# Patient Record
Sex: Female | Born: 1970
Health system: Southern US, Community
[De-identification: ages and names within clinical notes are randomized; demographics above are authoritative.]

## PROBLEM LIST (undated history)

## (undated) DIAGNOSIS — R519 Headache, unspecified: Secondary | ICD-10-CM

## (undated) DIAGNOSIS — H539 Unspecified visual disturbance: Secondary | ICD-10-CM

## (undated) DIAGNOSIS — G35 Multiple sclerosis: Secondary | ICD-10-CM

## (undated) DIAGNOSIS — R51 Headache: Secondary | ICD-10-CM

## (undated) HISTORY — DX: Headache, unspecified: R51.9

## (undated) HISTORY — PX: KNEE ARTHROSCOPY: SHX127

## (undated) HISTORY — DX: Unspecified visual disturbance: H53.9

## (undated) HISTORY — DX: Headache: R51

## (undated) HISTORY — DX: Multiple sclerosis: G35

---

## 1997-08-26 ENCOUNTER — Other Ambulatory Visit: Admission: RE | Admit: 1997-08-26 | Discharge: 1997-08-26 | Payer: Self-pay | Admitting: Obstetrics and Gynecology

## 1997-10-10 ENCOUNTER — Other Ambulatory Visit: Admission: RE | Admit: 1997-10-10 | Discharge: 1997-10-10 | Payer: Self-pay | Admitting: Obstetrics and Gynecology

## 1998-03-24 ENCOUNTER — Other Ambulatory Visit: Admission: RE | Admit: 1998-03-24 | Discharge: 1998-03-24 | Payer: Self-pay | Admitting: Obstetrics and Gynecology

## 1999-01-29 ENCOUNTER — Other Ambulatory Visit: Admission: RE | Admit: 1999-01-29 | Discharge: 1999-01-29 | Payer: Self-pay | Admitting: Obstetrics and Gynecology

## 2000-01-26 ENCOUNTER — Other Ambulatory Visit: Admission: RE | Admit: 2000-01-26 | Discharge: 2000-01-26 | Payer: Self-pay | Admitting: Obstetrics and Gynecology

## 2000-08-16 ENCOUNTER — Other Ambulatory Visit: Admission: RE | Admit: 2000-08-16 | Discharge: 2000-08-16 | Payer: Self-pay | Admitting: Obstetrics and Gynecology

## 2001-02-02 ENCOUNTER — Other Ambulatory Visit: Admission: RE | Admit: 2001-02-02 | Discharge: 2001-02-02 | Payer: Self-pay | Admitting: Obstetrics and Gynecology

## 2001-08-16 ENCOUNTER — Other Ambulatory Visit: Admission: RE | Admit: 2001-08-16 | Discharge: 2001-08-16 | Payer: Self-pay | Admitting: Obstetrics and Gynecology

## 2002-02-20 ENCOUNTER — Other Ambulatory Visit: Admission: RE | Admit: 2002-02-20 | Discharge: 2002-02-20 | Payer: Self-pay | Admitting: Obstetrics and Gynecology

## 2003-03-27 ENCOUNTER — Other Ambulatory Visit: Admission: RE | Admit: 2003-03-27 | Discharge: 2003-03-27 | Payer: Self-pay | Admitting: Obstetrics and Gynecology

## 2004-03-16 ENCOUNTER — Other Ambulatory Visit: Admission: RE | Admit: 2004-03-16 | Discharge: 2004-03-16 | Payer: Self-pay | Admitting: Obstetrics and Gynecology

## 2004-05-07 ENCOUNTER — Ambulatory Visit (HOSPITAL_COMMUNITY): Admission: RE | Admit: 2004-05-07 | Discharge: 2004-05-07 | Payer: Self-pay | Admitting: Orthopedic Surgery

## 2004-05-07 ENCOUNTER — Ambulatory Visit (HOSPITAL_BASED_OUTPATIENT_CLINIC_OR_DEPARTMENT_OTHER): Admission: RE | Admit: 2004-05-07 | Discharge: 2004-05-07 | Payer: Self-pay | Admitting: Orthopedic Surgery

## 2005-03-30 ENCOUNTER — Other Ambulatory Visit: Admission: RE | Admit: 2005-03-30 | Discharge: 2005-03-30 | Payer: Self-pay | Admitting: Obstetrics and Gynecology

## 2006-12-15 ENCOUNTER — Emergency Department: Payer: Self-pay | Admitting: Emergency Medicine

## 2007-01-04 ENCOUNTER — Ambulatory Visit (HOSPITAL_COMMUNITY): Admission: RE | Admit: 2007-01-04 | Discharge: 2007-01-04 | Payer: Self-pay | Admitting: Obstetrics and Gynecology

## 2010-07-03 NOTE — Op Note (Signed)
NAME:  Lindsay Carter, Lindsay Carter NO.:  [id number]   MEDICAL RECORD NO.:  [id number]          PATIENT TYPE:  AMB   LOCATION:  [facility]                         FACILITY:  [facility]   PHYSICIAN:  Marlowe Kays, M.D.  DATE OF BIRTH:  [birth year]   DATE OF PROCEDURE:  05/07/2004  DATE OF DISCHARGE:                                 OPERATIVE REPORT   PREOPERATIVE DIAGNOSIS:  Torn discoid lateral meniscus, left knee.   POSTOPERATIVE DIAGNOSES:  1.  Torn discoid lateral meniscus.  2.  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   PROCEDURE:  Left knee arthroscopy with (1) partial lateral meniscectomy, (2)  shaving of patella.   SURGEON:  Marlowe Kays, M.D.   ASSISTANT:  Nurse.   ANESTHESIA:  General.   INDICATIONS FOR PROCEDURE:  Because of a painful left knee, an MRI was  performed on April 03, 2004 which showed a discoid lateral meniscus.  At  surgery, she also had in some areas close to if not full-thickness wear of  her patella centrally.  The remainder of the joint looked normal.   DESCRIPTION OF PROCEDURE:  Under satisfactory general anesthesia with a  pneumatic tourniquet, the leg was Esmarched out nonsterilely.  Thigh  stabilizer, Ace wrap, and knee protector for the right leg.  The left leg  was prepped from stabilizer to the ankle, DuraPrep, draped in sterile field.   Superomedial saline inflow.  First through an anterolateral portal in the  medial portal, the knee joint was evaluated.  It was completely normal, with  normal meniscus on probing.  A representative picture was taken.  I then  lifted up the medial gutter in the suprapatellar area with the patellar  findings as noted above.  With a 3.5 shaver, I shaved this down until  smooth, and a final picture was taken.  I then reversed portals.  ACL looked  normal.  She had a good bit of synovitis laterally which I resected with the  3.5 shaver which allowed good visualization to the lateral  compartment of  the knee joint.  The joint surfaces basically were normal.  She did have  almost a complete discoid lateral meniscus with a substantial tear through  several areas posteriorly, one near the intercondylar area and one  posteriorly.  I trimmed back the meniscus to a stable rim, with a nice  smooth taper using baskets and smoothed it down with the 3.5 shaver.  The  remaining meniscus actually had almost a normal appearance, other than some  thickening anteriorly when I finished.  It was stable on probing.  The knee  joint was then irrigated until clear and all fluid possible removed.  The  two anterior portals were closed with 4-0 nylon, and 20 cc of 0.5% Marcaine  with adrenaline and 4 mg of morphine were then instilled through the inflow  apparatus which was removed and this portal closed with 4-0 nylon as well.  Betadine and Adaptic dressing were applied.  The tourniquet was released.   She tolerated the procedure well and was taken to  the recovery room in  satisfactory condition, with no known complications.      JA/MEDQ  D:  05/07/2004  T:  05/07/2004  Job:  [id number]

## 2011-05-10 ENCOUNTER — Other Ambulatory Visit: Payer: Self-pay | Admitting: Family Medicine

## 2011-05-10 ENCOUNTER — Other Ambulatory Visit (HOSPITAL_COMMUNITY)
Admission: RE | Admit: 2011-05-10 | Discharge: 2011-05-10 | Disposition: A | Discharge status: Home / Self Care | Payer: BC Managed Care – PPO | Source: Ambulatory Visit | Attending: Family Medicine | Admitting: Family Medicine

## 2011-05-10 DIAGNOSIS — Z124 Encounter for screening for malignant neoplasm of cervix: Secondary | ICD-10-CM | POA: Insufficient documentation

## 2011-05-10 DIAGNOSIS — Z1159 Encounter for screening for other viral diseases: Secondary | ICD-10-CM | POA: Insufficient documentation

## 2013-08-06 ENCOUNTER — Other Ambulatory Visit: Payer: Self-pay

## 2013-08-06 DIAGNOSIS — Z1231 Encounter for screening mammogram for malignant neoplasm of breast: Secondary | ICD-10-CM

## 2013-08-14 ENCOUNTER — Ambulatory Visit
Admission: RE | Admit: 2013-08-14 | Discharge: 2013-08-14 | Disposition: A | Discharge status: Home / Self Care | Payer: BC Managed Care – PPO | Source: Ambulatory Visit

## 2013-08-14 ENCOUNTER — Encounter (INDEPENDENT_AMBULATORY_CARE_PROVIDER_SITE_OTHER): Payer: Self-pay

## 2013-08-14 DIAGNOSIS — Z1231 Encounter for screening mammogram for malignant neoplasm of breast: Secondary | ICD-10-CM

## 2014-04-08 ENCOUNTER — Telehealth: Payer: Self-pay | Admitting: Neurology

## 2014-04-08 NOTE — Telephone Encounter (Signed)
Attempted to contact Lindsay Carter.  Phone rang without being answered, no ans. machine.  Need to let her now that mri can't be ordered until she's been seen at [facility].  Even tho she saw RAS at [facility], she must be an established pt. at [facility] before he can order mri/fim

## 2014-04-08 NOTE — Telephone Encounter (Signed)
Patient scheduled for 6 mo MS fu on 3/2 and normally have MRI prior to appointment.  Please call and advise.

## 2014-04-09 NOTE — Telephone Encounter (Signed)
Spoke with Lindsay Carter and advised mri can't be ordered until she has been seen here once.  In the future, we will be happy to order mri's/ov appt's to be done on the same day.  She verbalized understanding of same/fim

## 2014-04-17 ENCOUNTER — Ambulatory Visit: Payer: Self-pay | Admitting: Neurology

## 2014-04-23 ENCOUNTER — Encounter: Payer: Self-pay | Admitting: Neurology

## 2014-04-23 ENCOUNTER — Ambulatory Visit (INDEPENDENT_AMBULATORY_CARE_PROVIDER_SITE_OTHER): Payer: BLUE CROSS/BLUE SHIELD | Admitting: Neurology

## 2014-04-23 VITALS — BP 142/80 | HR 98 | Resp 14 | Ht 69.0 in | Wt 208.2 lb

## 2014-04-23 DIAGNOSIS — M545 Low back pain, unspecified: Secondary | ICD-10-CM

## 2014-04-23 DIAGNOSIS — G4700 Insomnia, unspecified: Secondary | ICD-10-CM | POA: Diagnosis not present

## 2014-04-23 DIAGNOSIS — R5383 Other fatigue: Secondary | ICD-10-CM | POA: Diagnosis not present

## 2014-04-23 DIAGNOSIS — G35 Multiple sclerosis: Secondary | ICD-10-CM | POA: Diagnosis not present

## 2014-04-23 DIAGNOSIS — M21371 Foot drop, right foot: Secondary | ICD-10-CM | POA: Insufficient documentation

## 2014-04-23 MED ORDER — ETODOLAC 400 MG PO TABS
400.0000 mg | ORAL_TABLET | Freq: Two times a day (BID) | ORAL | Status: DC
Start: 2014-04-23 — End: 2014-10-25

## 2014-04-23 NOTE — Progress Notes (Signed)
[facility]  PATIENT: Lindsay Carter DOB: 09/03/1970  REFERRING DOCTOR OR PCP:  Jordan Hawks Rankins   SOURCE: patient and [facility] records                    _________________________________   HISTORICAL  CHIEF COMPLAINT:  Chief Complaint  Patient presents with  . Multiple Sclerosis    Sts. she tolerates Copaxone well.  Thinks fatigue is some worse but thinks this may be due to allergies.  She takes Etodolac for right sided ankle, knee and hip pain, and needs a r/f of this/fim    HISTORY OF PRESENT ILLNESS:  Lindsay Carter is a 44 year old with relapsing remitting multiple sclerosis. Prior to 2010, she presented with several episodes of numbness and MRI of the spine was normal.   In 2010, she had an episode of limping and was referred to me.   MRI of the spine showed one focus and MRI of the brain showed multiple foci consistent withShconsistent with the clinical diagnosis of multiple sclerosis.  There was one enhancing plaque in the left posterior frontal lobe.   She was started on Copaxone and noted mild lipoatrophy. Of note, she is HLA DR 1501 positive. She was clinically stable, she initially went to Copaxone 20 mg every other day in 2012 and then switched to 40 mg 3 times a week in 2014. In 2015 she switch to 40 mg twice a week. She tolerates Copaxone fairly well but does have some skin reaction.  She has several symptoms related to her multiple sclerosis.  Gait/strength/sensation: Gait is impaired due to right foot drop that has been present since her initial transverse myelitis. She stumbles some but does not fall. She notes weakness only in the right leg. She denies any significant numbness in either leg. She notes no difficulty with her arms.   She stumbles more when on a carpet.     She does worse later in day when more fatigued.    Bladder:  She has mild urinary frequency and urgency that she notes is more for problem if she drinks a lot of  water. She has had some stress incontinence with coughing, but just drops, did not empty her bladder.  Vision: She denies any difficulty with vision. There is no diplopia or eye pain.  Fatigue/sleep: She has had fatigue since her diagnosis. Most days this is mild. She does worse when she has allergies.   Modafinil helped the fatigue some but caused a headache. She had some insomnia but that improved after she started taking melatonin.  Mood/cognition. She denies any significant depression or anxiety. She has not had any major cognitive dysfunction but feels processing speed has slowed some.  She uses lists as reminders.  She has some low back pain that is helped by etodolac.   She feels it is better than last year.    I reviewed MRI reports from 02/22/2008, 03/14/2009, 02/24/2010, 03/31/2011, to 01/04/2013, 04/25/2013.      The initial MRI showed one enhancing plaque. The 2011 MRI showed no changes in the one-year interim. However, in 2012, showed 1 new enhancing lesion in the left posterior frontal lobe. MRI in 2013, 2014 and 2015 were stable.   REVIEW OF SYSTEMS: Constitutional: No fevers, chills, sweats, or change in appetite Eyes: No visual changes, double vision, eye pain Ear, nose and throat: No hearing loss, ear pain, nasal congestion, sore throat Cardiovascular: No chest pain, palpitations Respiratory: No shortness of breath  at rest or with exertion.   No wheezes GastrointestinaI: No nausea, vomiting, diarrhea, abdominal pain, fecal incontinence Genitourinary: No dysuria, urinary retention.  Minimal frequency and minimal stress incontinence.  No nocturia. Musculoskeletal: No neck pain.   Improved Lower back pain Integumentary: No rash, pruritus, skin lesions Neurological: as above Psychiatric: No depression at this time.  No anxiety Endocrine: No palpitations, diaphoresis, change in appetite, change in weigh or increased thirst Hematologic/Lymphatic: No anemia, purpura,  petechiae. Allergic/Immunologic: No itchy/runny eyes, nasal congestion, recent allergic reactions, rashes  ALLERGIES: Allergies  Allergen Reactions  . Erythromycin Nausea And Vomiting    HOME MEDICATIONS:  Current outpatient prescriptions:  .  cetirizine (ZYRTEC) 10 MG tablet, Take 10 mg by mouth daily., Disp: , Rfl: 4 .  cholecalciferol (VITAMIN D) 1000 UNITS tablet, Take 2,000 Units by mouth daily., Disp: , Rfl:  .  COPAXONE 40 MG/ML SOSY, Inject 40 mg into the skin 2 (two) times a week., Disp: , Rfl: 1 .  Cranberry 125 MG TABS, Take 125 mg by mouth daily., Disp: , Rfl:  .  etodolac (LODINE) 400 MG tablet, Take 400 mg by mouth 2 (two) times daily., Disp: , Rfl: 11 .  Flaxseed, Linseed, (FLAXSEED OIL) 1200 MG CAPS, Take 1,200 mg by mouth daily., Disp: , Rfl:  .  fluticasone (FLONASE) 50 MCG/ACT nasal spray, , Disp: , Rfl: 2 .  JUNEL 1.5/30 1.5-30 MG-MCG tablet, Take 1 tablet by mouth daily., Disp: , Rfl: 3 .  Melatonin 3 MG CAPS, Take 3 mg by mouth at bedtime., Disp: , Rfl:  .  simvastatin (ZOCOR) 10 MG tablet, Take 10 mg by mouth every evening., Disp: , Rfl: 1 .  vitamin B-12 (CYANOCOBALAMIN) 500 MCG tablet, Take 500 mcg by mouth 2 (two) times daily., Disp: , Rfl:  .  vitamin C (ASCORBIC ACID) 500 MG tablet, Take 1,000 mg by mouth daily., Disp: , Rfl:   PAST MEDICAL HISTORY: Past Medical History  Diagnosis Date  . Headache   . Multiple sclerosis   . Vision abnormalities     PAST SURGICAL HISTORY: Past Surgical History  Procedure Laterality Date  . Knee arthroscopy Left     2006    FAMILY HISTORY: Family History  Problem Relation Age of Onset  . Hypertension Mother   . Ataxia Father   . CAD Father   . Parkinson's disease Father   . Bladder Cancer Father     SOCIAL HISTORY:  History   Social History  . Marital Status: Married    Spouse Name: N/A  . Number of Children: N/A  . Years of Education: N/A   Occupational History  . Glass blower/designer    Social  History Main Topics  . Smoking status: Never Smoker   . Smokeless tobacco: Not on file  . Alcohol Use: 0.0 oz/week    0 Standard drinks or equivalent per week     Comment: 2-3 beers per week/fim  . Drug Use: No  . Sexual Activity: Not on file   Other Topics Concern  . Not on file   Social History Narrative  . No narrative on file     PHYSICAL EXAM  Filed Vitals:   04/23/14 1046  BP: 142/80  Pulse: 98  Resp: 14  Height: '5\' 9"'  (1.753 m)  Weight: 208 lb 3.2 oz (94.439 kg)    Body mass index is 30.73 kg/(m^2).   General: The patient is well-developed and well-nourished and in no acute distress  Eyes:  Funduscopic  exam shows normal optic discs and retinal vessels.  Neck: The neck is supple, no carotid bruits are noted.  The neck is nontender.  Cardiovascular: The heart has a regular rate and rhythm with a normal S1 and S2. There were no murmurs, gallops or rubs. Lungs are clear to auscultation.  Skin: Extremities are without significant edema.  Musculoskeletal:  Back is nontender  Neurologic Exam  Mental status: The patient is alert and oriented x 3 at the time of the examination. The patient has apparent normal recent and remote memory, with an apparently normal attention span and concentration ability.   Speech is normal.  Cranial nerves: Extraocular movements are full. Pupils are equal, round, and reactive to light and accomodation.  Visual fields are full.  Facial symmetry is present. There is good facial sensation to soft touch bilaterally.Facial strength is normal.  Trapezius and sternocleidomastoid strength is normal. No dysarthria is noted.  The tongue is midline, and the patient has symmetric elevation of the soft palate. No obvious hearing deficits are noted.  Motor:  Muscle bulk is normal.   Tone is increased in R>L legs. Strength is  5 / 5 in all 4 extremities with 4++/5 at right ankle/EHL.   Sensory: Sensory testing is intact to pinprick, soft touch and  vibration sensation in arms but she has decreased vibration and touch in right foot.  Coordination: Cerebellar testing reveals good finger-nose-finger and heel-to-shin bilaterally.  Gait and station: Station is normal.   Gait shows right foot drop and is mildly wide. Tandem gait is wide. Romberg is negative.   Reflexes: Deep tendon reflexes are increased on both legs with spread at the knees and non-sustained clonus at right ankle.   Plantar responses are flexor.    DIAGNOSTIC DATA (LABS, IMAGING, TESTING) - I reviewed patient records, labs, notes, testing and imaging myself where available.     ASSESSMENT AND PLAN  Multiple sclerosis - Plan: MR Brain W Wo Contrast  Right foot drop - Plan: MR Brain W Wo Contrast  Other fatigue  Insomnia  Midline low back pain without sciatica   In summary, Tayja Manzer is a 44 year old woman with relapsing remitting multiple sclerosis who has been fairly stable on Copaxone therapy. Her main problem is a right due to mild right leg weakness, spasticity and numbness. This has worsened a little bit over the past few years I am going to send her to an orthotist to make an AFO brace or similar device for the right foot/ankle.  We had also discussed an electrostimulation foot drop that she was less interested. I will check an MRI of the brain with and without contrast to rule out subclinical progression. If this is occurring we will need to discuss more efficacious medicines. If the MRI is stable or there are no more than one new lesions, then she will likely stay on Copaxone.  She asked where there she can be vaccinated for shingles. She has had the pneumonia and flu shots in the past. Because Copaxone is a pure immunomodulator and does not have any immunosuppressive activity, it is safe for her to have a live vaccine such as the shingles.  She will return to see me in 6 months or sooner if she has new or worsening neurologic symptoms.  Minute  face-to-face evaluation with greater than 50% of the time counseling and coordinating care about her multiple sclerosis and related symptoms.   Falyn Rubel A. Felecia Shelling, MD, PhD 8/0/9983, 38:25 AM Certified in Neurology,  Clinical Neurophysiology, Sleep Medicine, Pain Medicine and Neuroimaging  [facility] [address], [place], [place] [address] [phone]

## 2014-05-08 ENCOUNTER — Ambulatory Visit (INDEPENDENT_AMBULATORY_CARE_PROVIDER_SITE_OTHER): Payer: BLUE CROSS/BLUE SHIELD

## 2014-05-15 ENCOUNTER — Other Ambulatory Visit: Payer: BLUE CROSS/BLUE SHIELD

## 2014-06-26 ENCOUNTER — Other Ambulatory Visit: Payer: BLUE CROSS/BLUE SHIELD

## 2014-07-08 ENCOUNTER — Telehealth: Payer: Self-pay | Admitting: Neurology

## 2014-07-08 MED ORDER — COPAXONE 40 MG/ML ~~LOC~~ SOSY: 40.0000 mg | PREFILLED_SYRINGE | SUBCUTANEOUS | Status: DC

## 2014-07-08 NOTE — Telephone Encounter (Signed)
We unfortunately have not gotten any faxes from [facility].  I called back.  Patient says she needs to get a refill on Coapxone 40mg .  Indicates she uses two injections weekly.  Thinks they may have faxed [facility] in error.  Thank you.

## 2014-07-08 NOTE — Telephone Encounter (Signed)
Patient is calling to see if [facility] has faxed over a request for Rx copaxone 40 mg. Please call patient.

## 2014-07-08 NOTE — Telephone Encounter (Signed)
I have spoken with Lindsay Carter this afternoon and advised that I have not received rx. request from [facility], but I have escribed Copaxone to them today/fim

## 2014-07-09 NOTE — Telephone Encounter (Signed)
I have spoken with [facility] and clarified rx./fim

## 2014-07-09 NOTE — Telephone Encounter (Signed)
Lindsay Carter with [facility] called requesting clarification on directions for  COPAXONE 40 MG/ML SOSY. Please call and advise. He can be reached at [phone].

## 2014-07-09 NOTE — Addendum Note (Signed)
Addended by: Candis Schatz I on: 07/09/2014 09:32 AM   Modules accepted: Orders

## 2014-07-10 NOTE — Telephone Encounter (Addendum)
Patient called regarding  COPAXONE 40 MG/ML SOSY and MRI. Please call and advise. Pt can be reached at [phone].

## 2014-07-11 NOTE — Telephone Encounter (Signed)
I have spoken with Lindsay Carter who sts. she just wanted to let me know that she received her Copaxone from the pharmacy, and has spoken with [name] about r/s mri.  I appreciate the feedback so that I know she has been taken care of.  She will call me if she needs anything else/fim

## 2014-07-18 ENCOUNTER — Other Ambulatory Visit: Payer: BLUE CROSS/BLUE SHIELD

## 2014-09-03 ENCOUNTER — Other Ambulatory Visit: Payer: Self-pay

## 2014-09-03 ENCOUNTER — Ambulatory Visit
Admission: RE | Admit: 2014-09-03 | Discharge: 2014-09-03 | Disposition: A | Discharge status: Home / Self Care | Payer: BLUE CROSS/BLUE SHIELD | Source: Ambulatory Visit | Attending: Neurology | Admitting: Neurology

## 2014-09-03 DIAGNOSIS — G35 Multiple sclerosis: Secondary | ICD-10-CM | POA: Diagnosis not present

## 2014-09-03 MED ORDER — GADOBENATE DIMEGLUMINE 529 MG/ML IV SOLN
19.0000 mL | Freq: Once | INTRAVENOUS | Status: AC | PRN
  Administered 2014-09-03: 19 mL via INTRAVENOUS

## 2014-09-06 ENCOUNTER — Telehealth: Payer: Self-pay | Admitting: *Deleted

## 2014-09-06 NOTE — Telephone Encounter (Signed)
LMOM (identified vm) that mri showed no brand new lesions--that I have requested old mri brain from [facility]--RAS will compare them and if there are changes on comparison, I will call her back.  She does not need to return this call unless she has questions/fim

## 2014-09-06 NOTE — Telephone Encounter (Signed)
-----   Message from Asa Lente, MD sent at 09/06/2014 10:48 AM EDT ----- MRI showed no brand new lesions    We will need to get last MRI's from [facility] for comparison

## 2014-10-03 ENCOUNTER — Other Ambulatory Visit: Payer: Self-pay | Admitting: Physician Assistant

## 2014-10-03 ENCOUNTER — Other Ambulatory Visit (HOSPITAL_COMMUNITY)
Admission: RE | Admit: 2014-10-03 | Discharge: 2014-10-03 | Disposition: A | Discharge status: Home / Self Care | Payer: BLUE CROSS/BLUE SHIELD | Source: Ambulatory Visit | Attending: Physician Assistant | Admitting: Physician Assistant

## 2014-10-03 DIAGNOSIS — Z1151 Encounter for screening for human papillomavirus (HPV): Secondary | ICD-10-CM | POA: Insufficient documentation

## 2014-10-03 DIAGNOSIS — Z124 Encounter for screening for malignant neoplasm of cervix: Secondary | ICD-10-CM | POA: Diagnosis present

## 2014-10-04 LAB — CYTOLOGY - PAP

## 2014-10-25 ENCOUNTER — Encounter: Payer: Self-pay | Admitting: Neurology

## 2014-10-25 ENCOUNTER — Ambulatory Visit (INDEPENDENT_AMBULATORY_CARE_PROVIDER_SITE_OTHER): Payer: BLUE CROSS/BLUE SHIELD | Admitting: Neurology

## 2014-10-25 ENCOUNTER — Telehealth: Payer: Self-pay | Admitting: *Deleted

## 2014-10-25 VITALS — BP 128/76 | HR 88 | Resp 16 | Ht 69.0 in | Wt 197.0 lb

## 2014-10-25 DIAGNOSIS — G4700 Insomnia, unspecified: Secondary | ICD-10-CM | POA: Diagnosis not present

## 2014-10-25 DIAGNOSIS — R5383 Other fatigue: Secondary | ICD-10-CM | POA: Diagnosis not present

## 2014-10-25 DIAGNOSIS — M21371 Foot drop, right foot: Secondary | ICD-10-CM | POA: Diagnosis not present

## 2014-10-25 DIAGNOSIS — G35 Multiple sclerosis: Secondary | ICD-10-CM | POA: Diagnosis not present

## 2014-10-25 MED ORDER — ETODOLAC 400 MG PO TABS: 400.0000 mg | ORAL_TABLET | Freq: Two times a day (BID) | ORAL | Status: DC

## 2014-10-25 NOTE — Telephone Encounter (Signed)
-----   Message from Asa Lente, MD sent at 10/25/2014  9:12 AM EDT ----- Please get her last brain (and spine if available) MRI's from [facility]

## 2014-10-25 NOTE — Progress Notes (Signed)
[facility]  PATIENT: Lindsay Carter DOB: 03-07-1970  REFERRING DOCTOR OR PCP:  Jordan Hawks Rankins   SOURCE: patient and [facility] records                    _________________________________   HISTORICAL  CHIEF COMPLAINT:  Chief Complaint  Patient presents with  . Multiple Sclerosis    Sts. she continues to tolerate Copaxone well.  She is ambulatory with slight limp, weaker right leg with some foot drop.  She has not had afo made yet.  Today she has a question regarding mmr shot and if she can have it./fim    HISTORY OF PRESENT ILLNESS:  Lindsay Carter is a 44 year old with relapsing remitting multiple sclerosis. She is on Copaxone and tolerates it well.   No definite exacerbations.  Her last MRI earlier this year showed no enhancing foci.   Older films were not available for comparison.   Gait/strength/sensation: She has a right foot drop that has been present since her initial transverse myelitis. She stumbles some but does not fall. She feels her foot drop has mildly worsened over the past few years but not since her last visit.   She never got the AFO.   She notes weakness only in the right leg. She denies any significant numbness in either leg. She notes no difficulty with her arms.     She does worse later in day when more fatigued.    Bladder:  She usually does well. She has mild urinary frequency and urgency some days, especially if she is drinking a lot of water.  She has had some stress incontinence with coughing, but just drops, did not empty her bladder.  Vision: She denies any difficulty with vision. There is no diplopia or eye pain.  Fatigue/sleep: She has had fatigue since her diagnosis. Most days this is mild. She does worse when she has allergies.   Modafinil helped the fatigue some but caused a headache and she stopped. She had some insomnia with early awakening some days but she falls asleep easily.     Mood/cognition. She  denies any significant depression or anxiety. She has not had any major cognitive dysfunction but feels processing speed has slowed some.  She uses lists as reminders.  She has mild non radiating axial low back pain that is helped by etodolac.   MS History:   Prior to 2010, she presented with several episodes of numbness and MRI of the spine was normal.   In 2010, she had an episode of limping and was referred to me.   MRI of the spine showed one focus and MRI of the brain showed multiple foci consistent withShconsistent with the clinical diagnosis of multiple sclerosis.  There was one enhancing plaque in the left posterior frontal lobe.   She was started on Copaxone and noted mild lipoatrophy. Of note, she is HLA DR 1501 positive. She was clinically stable, she initially went to Copaxone 20 mg every other day in 2012 and then switched to 40 mg 3 times a week in 2014. In 2015 she switch to 40 mg twice a week.     REVIEW OF SYSTEMS: Constitutional: No fevers, chills, sweats, or change in appetite Eyes: No visual changes, double vision, eye pain Ear, nose and throat: No hearing loss, ear pain, nasal congestion, sore throat Cardiovascular: No chest pain, palpitations Respiratory: No shortness of breath at rest or with exertion.   No wheezes GastrointestinaI: No  nausea, vomiting, diarrhea, abdominal pain, fecal incontinence Genitourinary: No dysuria, urinary retention.  Minimal frequency and minimal stress incontinence.  No nocturia. Musculoskeletal: No neck pain.   Improved Lower back pain Integumentary: No rash, pruritus, skin lesions Neurological: as above Psychiatric: No depression at this time.  No anxiety Endocrine: No palpitations, diaphoresis, change in appetite, change in weigh or increased thirst Hematologic/Lymphatic: No anemia, purpura, petechiae. Allergic/Immunologic: No itchy/runny eyes, nasal congestion, recent allergic reactions, rashes  ALLERGIES: Allergies  Allergen  Reactions  . Erythromycin Nausea And Vomiting    HOME MEDICATIONS:  Current outpatient prescriptions:  .  cetirizine (ZYRTEC) 10 MG tablet, Take 10 mg by mouth daily., Disp: , Rfl: 4 .  cholecalciferol (VITAMIN D) 1000 UNITS tablet, Take 2,000 Units by mouth daily., Disp: , Rfl:  .  COPAXONE 40 MG/ML SOSY, Inject 40 mg into the skin 2 (two) times a week., Disp: 24 Syringe, Rfl: 3 .  Cranberry 125 MG TABS, Take 125 mg by mouth daily., Disp: , Rfl:  .  etodolac (LODINE) 400 MG tablet, Take 1 tablet (400 mg total) by mouth 2 (two) times daily., Disp: 60 tablet, Rfl: 11 .  fluticasone (FLONASE) 50 MCG/ACT nasal spray, , Disp: , Rfl: 2 .  JUNEL 1.5/30 1.5-30 MG-MCG tablet, Take 1 tablet by mouth daily., Disp: , Rfl: 3 .  Melatonin 3 MG CAPS, Take 3 mg by mouth at bedtime., Disp: , Rfl:  .  simvastatin (ZOCOR) 10 MG tablet, Take 10 mg by mouth every evening., Disp: , Rfl: 1 .  Flaxseed, Linseed, (FLAXSEED OIL) 1200 MG CAPS, Take 1,200 mg by mouth daily., Disp: , Rfl:  .  vitamin B-12 (CYANOCOBALAMIN) 500 MCG tablet, Take 500 mcg by mouth 2 (two) times daily., Disp: , Rfl:  .  vitamin C (ASCORBIC ACID) 500 MG tablet, Take 1,000 mg by mouth daily., Disp: , Rfl:   PAST MEDICAL HISTORY: Past Medical History  Diagnosis Date  . Headache   . Multiple sclerosis   . Vision abnormalities     PAST SURGICAL HISTORY: Past Surgical History  Procedure Laterality Date  . Knee arthroscopy Left     2006    FAMILY HISTORY: Family History  Problem Relation Age of Onset  . Hypertension Mother   . Ataxia Father   . CAD Father   . Parkinson's disease Father   . Bladder Cancer Father     SOCIAL HISTORY:  Social History   Social History  . Marital Status: Married    Spouse Name: N/A  . Number of Children: N/A  . Years of Education: N/A   Occupational History  . Glass blower/designer    Social History Main Topics  . Smoking status: Never Smoker   . Smokeless tobacco: Not on file  . Alcohol Use:  0.0 oz/week    0 Standard drinks or equivalent per week     Comment: 2-3 beers per week/fim  . Drug Use: No  . Sexual Activity: Not on file   Other Topics Concern  . Not on file   Social History Narrative     PHYSICAL EXAM  Filed Vitals:   10/25/14 0854  BP: 128/76  Pulse: 88  Resp: 16  Height: _0  (1.753 m)  Weight: 197 lb (89.359 kg)    Body mass index is 29.08 kg/(m^2).   General: The patient is well-developed and well-nourished and in no acute distress   Neurologic Exam  Mental status: The patient is alert and oriented x 3 at the  time of the examination. The patient has apparent normal recent and remote memory, with an apparently normal attention span and concentration ability.   Speech is normal.  Cranial nerves: Extraocular movements are full.    There is good facial sensation to soft touch bilaterally.Facial strength is normal.  Trapezius and sternocleidomastoid strength is normal. No dysarthria is noted.  No obvious hearing deficits are noted.  Motor:  Muscle bulk is normal.   Tone is increased in R>L legs. Strength is  5 / 5 in all 4 extremities with 4++/5 at right ankle/EHL.   Sensory: Sensory testing is intact to pinprick, soft touch and vibration sensation in arms but she has decreased vibration and touch in right foot.  Coordination: Cerebellar testing reveals good finger-nose-finger and heel-to-shin bilaterally.  Gait and station: Station is normal.   Gait shows right foot drop and is mildly wide. Tandem gait is wide. Romberg is negative.   Reflexes: Deep tendon reflexes are increased on both legs with spread at the knees and non-sustained clonus at right ankle.        DIAGNOSTIC DATA (LABS, IMAGING, TESTING) - I reviewed patient records, labs, notes, testing and imaging myself where available.     ASSESSMENT AND PLAN  Multiple sclerosis  Right foot drop  Other fatigue  Insomnia  1.   Continue Copaxone. 2.   She will reconsider getting  an AFO and will call us if she wants referral.   3.   Renew NSAID 4.   I will try to get 2015 MRI for comparison. 5.  Ok to get MMR vaccination and shingles vaccination. She will return to see me in 6 months or sooner if she has new or worsening neurologic symptoms.   Latima Hamza A. Felecia Shelling, MD, PhD 09/22/7577, 7:28 AM Certified in Neurology, Clinical Neurophysiology, Sleep Medicine, Pain Medicine and Neuroimaging  [facility] [address] [place], [place] [address] [phone]

## 2014-10-25 NOTE — Telephone Encounter (Signed)
Old mri's (last 3 brain with and without) requested from [place] at [facility].  She has not had any mri's of the spine done/fim

## 2014-10-31 ENCOUNTER — Telehealth: Payer: Self-pay | Admitting: *Deleted

## 2014-10-31 NOTE — Telephone Encounter (Signed)
-----   Message from Asa Lente, MD sent at 10/31/2014 10:24 AM EDT ----- Please let her know that I was able to compare the recent MRI with her 2015 MRI. There are no changes.

## 2014-10-31 NOTE — Telephone Encounter (Signed)
I have spoken with Lindsay Carter this morning and per RAS, advised he has compared old and most recent mri's and there have been no changes.  She verbalized understanding of same/fim

## 2015-02-03 ENCOUNTER — Telehealth: Payer: Self-pay | Admitting: Neurology

## 2015-02-03 NOTE — Telephone Encounter (Signed)
I have spoken with Lindsay Carter this afternoon and let her know Hycosamine is ok to take/fim

## 2015-02-03 NOTE — Telephone Encounter (Signed)
Patient is calling as her doctor wants to prescribe Rx Hycosamine for a stomach problem she has but her doctor wants to make sure this medication does not conflict with any other medications she is taking. Please call.

## 2015-02-22 ENCOUNTER — Other Ambulatory Visit: Payer: Self-pay

## 2015-02-22 MED ORDER — ETODOLAC 400 MG PO TABS: 400.0000 mg | ORAL_TABLET | Freq: Two times a day (BID) | ORAL | Status: DC

## 2015-04-24 ENCOUNTER — Ambulatory Visit (INDEPENDENT_AMBULATORY_CARE_PROVIDER_SITE_OTHER): Payer: BLUE CROSS/BLUE SHIELD | Admitting: Neurology

## 2015-04-24 ENCOUNTER — Encounter: Payer: Self-pay | Admitting: Neurology

## 2015-04-24 VITALS — BP 130/90 | HR 78 | Resp 14 | Ht 69.0 in | Wt 201.0 lb

## 2015-04-24 DIAGNOSIS — M25561 Pain in right knee: Secondary | ICD-10-CM

## 2015-04-24 DIAGNOSIS — M21371 Foot drop, right foot: Secondary | ICD-10-CM

## 2015-04-24 DIAGNOSIS — G4700 Insomnia, unspecified: Secondary | ICD-10-CM

## 2015-04-24 DIAGNOSIS — G35 Multiple sclerosis: Secondary | ICD-10-CM | POA: Diagnosis not present

## 2015-04-24 DIAGNOSIS — R5383 Other fatigue: Secondary | ICD-10-CM

## 2015-04-24 DIAGNOSIS — M2550 Pain in unspecified joint: Secondary | ICD-10-CM | POA: Diagnosis not present

## 2015-04-24 DIAGNOSIS — M25551 Pain in right hip: Secondary | ICD-10-CM | POA: Diagnosis not present

## 2015-04-24 DIAGNOSIS — G35D Multiple sclerosis, unspecified: Secondary | ICD-10-CM

## 2015-04-24 NOTE — Progress Notes (Signed)
[facility]  PATIENT: Lindsay Carter DOB: Mar 04, 1970  REFERRING DOCTOR OR PCP:  Jordan Hawks Rankins   SOURCE: patient and [facility] records                    _________________________________   HISTORICAL  CHIEF COMPLAINT:  Chief Complaint  Patient presents with  . Multiple Sclerosis    Sts. she continues to tolerate Copaxone well.  She is having more right knee and hip pain and would like to discuss possible Rheumatology referral.  Sts. Etodolac helps, but she finds herself taking more Tylenol.  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  She would also like ot discuss a referral to a chiropractor./fim    HISTORY OF PRESENT ILLNESS:  Lindsay Carter is a 45 year old with relapsing remitting multiple sclerosis. She is on Copaxone and tolerates it well.   No definite exacerbations.  Her last MRI in 2016 showed no enhancing foci.     Joint pain:   She has a new problem with pain in the right > left hip, right knee and right ankle.   She has noted mild swelling in her right knee.    Pain increases with walking and activity.   Pain also increases at night when she is in bed and sometimes the pain wakes her up She hasn't had with rheumatoid arthritis.   Gait/strength/sensation: She feels her gait is slower.    She has had more joint pain on the right.   She has a right foot drop.  It has been present since her initial transverse myelitis. She stumbles and fell once last month on an uneven surface.   Her foot drop has mildly worsened over the past few years but not since her last visit.   She never got the AFO.   No other weakness.   . She denies any significant numbness in either leg or arm.     Gait does worse later in day when more fatigued.    Bladder:  She usually does well. She has mild urinary frequency and urgency some days, especially if she is drinking a lot of water.  She has minimal stress incontinence with coughing.    Vision: She  denies any difficulty with vision. There is no diplopia or eye pain.  Fatigue/sleep: She has had fatigue since her diagnosis and feels more tired getting a house ready for sale.  . Most days this is mild. She does worse when she has allergies.   Modafinil caused  headache and she stopped. It had helped.   She had some insomnia with early awakening some days but she falls asleep easily.   She often wakes up with joint pain  Mood/cognition. She denies any significant depression or anxiety. She has not had any major cognitive dysfunction but feels processing speed has slowed some.  She uses lists as reminders.  MS History:   Prior to 2010, she presented with several episodes of numbness and MRI of the spine was normal.   In 2010, she had an episode of limping and was referred to me.   MRI of the spine showed one focus and MRI of the brain showed multiple foci consistent withShconsistent with the clinical diagnosis of multiple sclerosis.  There was one enhancing plaque in the left posterior frontal lobe.   She was started on Copaxone and noted mild lipoatrophy. Of note, she is HLA DR 1501 positive. She was clinically stable, she initially went  to Copaxone 20 mg every other day in 2012 and then switched to 40 mg 3 times a week in 2014. In 2015 she switch to 40 mg twice a week.     REVIEW OF SYSTEMS: Constitutional: No fevers, chills, sweats, or change in appetite Eyes: No visual changes, double vision, eye pain Ear, nose and throat: No hearing loss, ear pain, nasal congestion, sore throat Cardiovascular: No chest pain, palpitations Respiratory: No shortness of breath at rest or with exertion.   No wheezes GastrointestinaI: No nausea, vomiting, diarrhea, abdominal pain, fecal incontinence Genitourinary: No dysuria, urinary retention.  Minimal frequency and minimal stress incontinence.  No nocturia. Musculoskeletal: No neck pain.   Improved Lower back pain Integumentary: No rash, pruritus, skin  lesions Neurological: as above Psychiatric: No depression at this time.  No anxiety Endocrine: No palpitations, diaphoresis, change in appetite, change in weigh or increased thirst Hematologic/Lymphatic: No anemia, purpura, petechiae. Allergic/Immunologic: No itchy/runny eyes, nasal congestion, recent allergic reactions, rashes  ALLERGIES: Allergies  Allergen Reactions  . Erythromycin Nausea And Vomiting    HOME MEDICATIONS:  Current outpatient prescriptions:  .  acetaminophen (TYLENOL) 500 MG tablet, Take 1,000 mg by mouth every 6 (six) hours as needed., Disp: , Rfl:  .  cetirizine (ZYRTEC) 10 MG tablet, Take 10 mg by mouth daily., Disp: , Rfl: 4 .  cholecalciferol (VITAMIN D) 1000 UNITS tablet, Take 2,000 Units by mouth daily., Disp: , Rfl:  .  COPAXONE 40 MG/ML SOSY, Inject 40 mg into the skin 2 (two) times a week., Disp: 24 Syringe, Rfl: 3 .  Cranberry 125 MG TABS, Take 125 mg by mouth daily., Disp: , Rfl:  .  diphenhydrAMINE (SOMINEX) 25 MG tablet, Take 50 mg by mouth at bedtime as needed for sleep., Disp: , Rfl:  .  etodolac (LODINE) 400 MG tablet, Take 1 tablet (400 mg total) by mouth 2 (two) times daily., Disp: 180 tablet, Rfl: 0 .  fluticasone (FLONASE) 50 MCG/ACT nasal spray, , Disp: , Rfl: 2 .  hyoscyamine (ANASPAZ) 0.125 MG TBDP disintergrating tablet, Place 0.125 mg under the tongue., Disp: , Rfl:  .  JUNEL 1.5/30 1.5-30 MG-MCG tablet, Take 1 tablet by mouth daily., Disp: , Rfl: 3 .  loratadine-pseudoephedrine (CLARITIN-D 12-HOUR) 5-120 MG tablet, Take 1 tablet by mouth at bedtime as needed for allergies., Disp: , Rfl:  .  Melatonin 3 MG CAPS, Take 3 mg by mouth at bedtime., Disp: , Rfl:  .  Minoxidil (ROGAINE WOMENS) 5 % FOAM, Apply topically., Disp: , Rfl:  .  Multiple Vitamin (MULTIVITAMIN) tablet, Take 1 tablet by mouth daily., Disp: , Rfl:  .  polyethylene glycol (MIRALAX / GLYCOLAX) packet, Take 17 g by mouth daily., Disp: , Rfl:  .  Rutin 50 MG TABS, Take by  mouth., Disp: , Rfl:  .  simvastatin (ZOCOR) 10 MG tablet, Take 10 mg by mouth every evening., Disp: , Rfl: 1 .  Flaxseed, Linseed, (FLAXSEED OIL) 1200 MG CAPS, Take 1,200 mg by mouth daily. Reported on 04/24/2015, Disp: , Rfl:  .  vitamin B-12 (CYANOCOBALAMIN) 500 MCG tablet, Take 500 mcg by mouth 2 (two) times daily. Reported on 04/24/2015, Disp: , Rfl:  .  vitamin C (ASCORBIC ACID) 500 MG tablet, Take 1,000 mg by mouth daily. Reported on 04/24/2015, Disp: , Rfl:   PAST MEDICAL HISTORY: Past Medical History  Diagnosis Date  . Headache   . Multiple sclerosis ([place])   . Vision abnormalities     PAST SURGICAL HISTORY: Past  Surgical History  Procedure Laterality Date  . Knee arthroscopy Left     2006    FAMILY HISTORY: Family History  Problem Relation Age of Onset  . Hypertension Mother   . Ataxia Father   . CAD Father   . Parkinson's disease Father   . Bladder Cancer Father     SOCIAL HISTORY:  Social History   Social History  . Marital Status: Married    Spouse Name: N/A  . Number of Children: N/A  . Years of Education: N/A   Occupational History  . Glass blower/designer    Social History Main Topics  . Smoking status: Never Smoker   . Smokeless tobacco: Not on file  . Alcohol Use: 0.0 oz/week    0 Standard drinks or equivalent per week     Comment: 2-3 beers per week/fim  . Drug Use: No  . Sexual Activity: Not on file   Other Topics Concern  . Not on file   Social History Narrative     PHYSICAL EXAM  Filed Vitals:   04/24/15 0846  BP: 130/90  Pulse: 78  Resp: 14  Height: _0  (1.753 m)  Weight: 201 lb (91.173 kg)    Body mass index is 29.67 kg/(m^2).   General: The patient is well-developed and well-nourished and in no acute distress  Musculoskeletal:  Right arm and left side have full range of motion and no significant joint tenderness.  The right leg, she has mild tenderness over the bursa. Corky Sox sign is negative.   Right knee has normal range of  motion but is tender to deep palpation. Right ankle has normal range of motion and is tender to deep palpation   Neurologic Exam  Mental status: The patient is alert and oriented x 3 at the time of the examination. The patient has apparent normal recent and remote memory, with an apparently normal attention span and concentration ability.   Speech is normal.  Cranial nerves: Extraocular movements are full.    There is good facial sensation to soft touch bilaterally.Facial strength is normal.  Trapezius and sternocleidomastoid strength is normal. No dysarthria is noted.  No obvious hearing deficits are noted.  Motor:  Muscle bulk is normal.   Tone is increased in R>L legs. Strength is  5 / 5 in all 4 extremities with 4++/5 at right ankle/EHL.   Sensory: Sensory testing is intact to pinprick, soft touch and vibration sensation in arms but she has decreased vibration and touch in right foot.  Coordination: Cerebellar testing reveals good finger-nose-finger and heel-to-shin bilaterally.  Gait and station: Station is normal.   Gait shows right foot drop and is mildly wide. Tandem gait is wide. Romberg is negative.   Reflexes: Deep tendon reflexes are increased on both legs with spread at the knees and non-sustained clonus at right ankle.        DIAGNOSTIC DATA (LABS, IMAGING, TESTING) - I reviewed patient records, labs, notes, testing and imaging myself where available.     ASSESSMENT AND PLAN  Multiple sclerosis (H[facility])  Right foot drop  Multiple joint pain  Right hip pain  Knee pain, right  Other fatigue  Insomnia   1.   Continue Copaxone. 2.   When she is ready, we will refer for an AFO  3.   NSAID for pain 4.   Most likely her joint pain is due to osteoarthritis, possibly exacerbated by her altered gait from the MS.   However, MS patients have a  mildly higher incidence of rheumatoid arthritis and psoriatic arthritis than other people.   Therefore, we need to rule out  inflammatory arthritis. Check ESR, ANA, RF, uric acid and x-rays to assess for treatable causes of arthritis. We will also check an x-ray of the right hip and the right knee.   She will return to see me in 6 months or sooner if she has new or worsening neurologic symptoms.   Shadman Tozzi A. Felecia Shelling, MD, PhD 04/15/5398, 8:67 AM Certified in Neurology, Clinical Neurophysiology, Sleep Medicine, Pain Medicine and Neuroimaging  [facility] [address] [place], [place] [address] [phone]

## 2015-04-25 ENCOUNTER — Telehealth: Payer: Self-pay | Admitting: *Deleted

## 2015-04-25 LAB — ANA W/REFLEX: Anti Nuclear Antibody(ANA): NEGATIVE

## 2015-04-25 LAB — SEDIMENTATION RATE: SED RATE: 2 mm/h (ref 0–32)

## 2015-04-25 LAB — RHEUMATOID FACTOR

## 2015-04-25 LAB — URIC ACID: Uric Acid: 3.8 mg/dL (ref 2.5–7.1)

## 2015-04-25 NOTE — Telephone Encounter (Signed)
I have spoken with Lindsay Carter and per RAS, advised that arthritis labs look normal.  She verbalized understanding of same/fim

## 2015-04-25 NOTE — Telephone Encounter (Signed)
-----   Message from Asa Lente, MD sent at 04/25/2015 11:49 AM EST ----- Please let her know that the lab work for inflammatory arthritis looks normal.

## 2015-05-08 ENCOUNTER — Ambulatory Visit
Admission: RE | Admit: 2015-05-08 | Discharge: 2015-05-08 | Disposition: A | Discharge status: Home / Self Care | Payer: BLUE CROSS/BLUE SHIELD | Source: Ambulatory Visit | Attending: Neurology | Admitting: Neurology

## 2015-05-08 DIAGNOSIS — M25561 Pain in right knee: Secondary | ICD-10-CM

## 2015-05-08 DIAGNOSIS — M2550 Pain in unspecified joint: Secondary | ICD-10-CM

## 2015-05-08 DIAGNOSIS — M25551 Pain in right hip: Secondary | ICD-10-CM

## 2015-05-12 ENCOUNTER — Telehealth: Payer: Self-pay | Admitting: *Deleted

## 2015-05-12 MED ORDER — TRAMADOL HCL 50 MG PO TABS: 50.0000 mg | ORAL_TABLET | Freq: Three times a day (TID) | ORAL | Status: DC | PRN

## 2015-05-12 NOTE — Telephone Encounter (Signed)
I have spoken with Betania and per RAS, offered Tramadol 50mg  po tid prn and ortho referral.  She verbalized understanding of same and is agreeable with this plan.  Sts. she does not need ortho referral with her insurance, so she will decide who she wants to see and call them herself--and let me know if she needs anything else.  Rx. faxed to [facility] in [place] per her  request/fim

## 2015-05-12 NOTE — Telephone Encounter (Signed)
I have spoken with Lindsay Carter and per RAS, advised that x-rays of knee and ankle show mild arthritis, and the hip was fine.  She verbalized understanding of same, but sts. pain is still worse; would like to know what med changes can be made.  Will check with RAS and call her back/fim

## 2015-05-12 NOTE — Telephone Encounter (Signed)
-----   Message from Asa Lente, MD sent at 05/09/2015 12:23 PM EDT ----- Please at her know that the knee and ankle x-ray showed mild arthritis. The hip was fine.   Continue etodolac

## 2015-05-12 NOTE — Telephone Encounter (Signed)
-----   Message from Richard A Sater, MD sent at 05/09/2015 12:23 PM EDT ----- Please at her know that the knee and ankle x-ray showed mild arthritis. The hip was fine.   Continue etodolac 

## 2015-05-12 NOTE — Telephone Encounter (Signed)
LMTC and per RAS,  will offer Tramadol 50mg  po tid prn pain, and/or referral to ortho/fim

## 2015-05-12 NOTE — Telephone Encounter (Signed)
Patient returned Lindsay Carter's call °

## 2015-05-13 NOTE — Telephone Encounter (Signed)
Pt is asking for call back , she has some questions for Faith about the medication.

## 2015-05-13 NOTE — Telephone Encounter (Signed)
I have spoken with Lindsay Carter this morning--she had questions about Tramadol--specifically asking if it is an opiate (she has concerns about meds that are addictive).  I have advised that Tramadol was reclassified as a controlled substance a few yrs. ago, but is not an opiate.  She verbalized understanding of same/fim

## 2015-05-14 NOTE — Telephone Encounter (Signed)
j

## 2015-06-18 ENCOUNTER — Telehealth: Payer: Self-pay | Admitting: *Deleted

## 2015-06-18 MED ORDER — COPAXONE 40 MG/ML ~~LOC~~ SOSY
40.0000 mg | PREFILLED_SYRINGE | SUBCUTANEOUS | Status: DC
Start: 2015-06-18 — End: 2016-01-23

## 2015-06-18 NOTE — Telephone Encounter (Signed)
Copaxone rx. escribed to [facility] per faxed request/fim

## 2015-07-03 ENCOUNTER — Telehealth: Payer: Self-pay | Admitting: Neurology

## 2015-07-03 NOTE — Telephone Encounter (Signed)
[facility] pharm called and needs clarification on COPAXONE 40 MG/ML SOSY Phone: [phone], ext: [phone] (directly to pharmacist)

## 2015-07-03 NOTE — Telephone Encounter (Signed)
I have spoken with Tang at [facility] pharmacy this morning./fim

## 2015-07-07 ENCOUNTER — Telehealth: Payer: Self-pay | Admitting: *Deleted

## 2015-07-07 NOTE — Telephone Encounter (Signed)
I have spoken with Tang at [facility] and confirmed that pt. takes Copaxone inj. twice a week, not 3 times per week/fim

## 2015-07-07 NOTE — Telephone Encounter (Signed)
Caller [facility]    CID [id number]  Patient Lindsay Carter        Pt's Dr Epimenio Foot        Area Code 866 Phone# [phone] * DOB Jan 24, 2071    RE CLARIFICATION IS NEEDED FOR PATIENTS RX

## 2015-08-27 DIAGNOSIS — L718 Other rosacea: Secondary | ICD-10-CM | POA: Diagnosis not present

## 2015-09-10 DIAGNOSIS — S7011XA Contusion of right thigh, initial encounter: Secondary | ICD-10-CM | POA: Diagnosis not present

## 2015-09-30 ENCOUNTER — Other Ambulatory Visit: Payer: Self-pay | Admitting: Family Medicine

## 2015-09-30 DIAGNOSIS — Z1231 Encounter for screening mammogram for malignant neoplasm of breast: Secondary | ICD-10-CM

## 2015-10-08 DIAGNOSIS — Z79899 Other long term (current) drug therapy: Secondary | ICD-10-CM | POA: Diagnosis not present

## 2015-10-08 DIAGNOSIS — G35 Multiple sclerosis: Secondary | ICD-10-CM | POA: Diagnosis not present

## 2015-10-08 DIAGNOSIS — Z0001 Encounter for general adult medical examination with abnormal findings: Secondary | ICD-10-CM | POA: Diagnosis not present

## 2015-10-08 DIAGNOSIS — Z23 Encounter for immunization: Secondary | ICD-10-CM | POA: Diagnosis not present

## 2015-10-08 DIAGNOSIS — E7800 Pure hypercholesterolemia, unspecified: Secondary | ICD-10-CM | POA: Diagnosis not present

## 2015-10-24 ENCOUNTER — Ambulatory Visit: Payer: BLUE CROSS/BLUE SHIELD | Admitting: Neurology

## 2015-11-04 ENCOUNTER — Ambulatory Visit: Payer: BLUE CROSS/BLUE SHIELD

## 2015-11-13 ENCOUNTER — Ambulatory Visit
Admission: RE | Admit: 2015-11-13 | Discharge: 2015-11-13 | Disposition: A | Discharge status: Home / Self Care | Payer: BLUE CROSS/BLUE SHIELD | Source: Ambulatory Visit | Attending: Family Medicine | Admitting: Family Medicine

## 2015-11-13 DIAGNOSIS — Z1231 Encounter for screening mammogram for malignant neoplasm of breast: Secondary | ICD-10-CM | POA: Diagnosis not present

## 2015-12-24 ENCOUNTER — Ambulatory Visit (INDEPENDENT_AMBULATORY_CARE_PROVIDER_SITE_OTHER): Payer: BLUE CROSS/BLUE SHIELD | Admitting: Neurology

## 2015-12-24 ENCOUNTER — Encounter: Payer: Self-pay | Admitting: Neurology

## 2015-12-24 VITALS — BP 142/88 | HR 72 | Resp 16 | Ht 69.0 in | Wt 206.5 lb

## 2015-12-24 DIAGNOSIS — R5383 Other fatigue: Secondary | ICD-10-CM

## 2015-12-24 DIAGNOSIS — G35 Multiple sclerosis: Secondary | ICD-10-CM | POA: Diagnosis not present

## 2015-12-24 DIAGNOSIS — Z6830 Body mass index (BMI) 30.0-30.9, adult: Secondary | ICD-10-CM

## 2015-12-24 DIAGNOSIS — E6609 Other obesity due to excess calories: Secondary | ICD-10-CM | POA: Diagnosis not present

## 2015-12-24 DIAGNOSIS — M21371 Foot drop, right foot: Secondary | ICD-10-CM

## 2015-12-24 DIAGNOSIS — M2550 Pain in unspecified joint: Secondary | ICD-10-CM

## 2015-12-24 DIAGNOSIS — E669 Obesity, unspecified: Secondary | ICD-10-CM | POA: Insufficient documentation

## 2015-12-24 NOTE — Progress Notes (Signed)
[facility]  PATIENT: Lindsay Carter DOB: 10/09/1970  REFERRING DOCTOR OR PCP:  Jordan Hawks Rankins   SOURCE: patient and [facility] records                    _________________________________   HISTORICAL  CHIEF COMPLAINT:  Chief Complaint  Patient presents with  . Multiple Sclerosis    Sts. she continues to tolerate Copaxone well.  Still having some difficulty with right leg weakness/foot drop, but sts. is not ready to get an AFO yet.  Sts. she has had several falls over the last yr.--sts. she thinks this is due to slicker floor surface, than is unable to regain her balance. Sts. she contines to hav some difficulty with bowel urgency and has had 3 episodes where she was not able to get to a bathroom in time.  Sts. she continues to have multiple joint pain.  She filled Tramadol rx. but  . Gait Disturbance    has not taken it due to fear of addicting meds.  Sts. she would like to have her next MRI in Jan. 2018, when [employer] account is renewed.  She would like a referral to a dietician.  Sts. she works about 60 hrs. per week. 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  Her employer is not aware of her MS dx. and she does not want them to be aware.  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 She is on Copaxone and tolerates it well.   She has not had any definite exacerbations.  Her last MRI 09/03/2014 showed Chronic demyelinating plaques consistent with MS in the hemispheres with a relatively low plaque burden. There were no enhancing foci.   A thoracic spine from 2009 shows plaque within the spinal cord adjacent to T4T5 and T6-T7.    Gait/strength/sensation: She feels her gait is a little worse.   She has had 3 falls over the past 25 months. All falls ar on slippery  surfaces --- if she stumbles she may fall.   She has a right foot drop, present since her initial transverse myelitis in 2009.    She never got the AFO.  Foot drop is worse with longer distances.    No other weakness.   . She denies any significant numbness in either leg or arm.     Gait does worse later in day when more fatigued.    Bladder:  She usually does well. She has mild urinary frequency and urgency some days, especially if she is drinking a lot of water.  Three times she has had bowel urgency incontinence events.   Vision: She denies any difficulty with vision. There is no diplopia or eye pain.  Fatigue/sleep: She has had fatigue since 2009 . Most days this is mild. She does worse when she has allergies.   Modafinil caused  headache and she stopped. It had helped.   She had some insomnia with early awakening some days but she falls asleep easily.   She often wakes up with joint pain  Mood/cognition. She denies any significant depression or anxiety. She has not had any major cognitive dysfunction but feels processing speed has slowed some.  She uses lists as reminders.  Joint pain:   She has  pain in the right > left hip, right knee and right ankle.  She is on Lodine  She filled the tramadol script but has not taken any.    MS History:   Prior to 2010, she presented with several episodes of numbness and MRI of the spine was reportedly normal (however on my recent review there appear to be 2 thoracic plaques at T4T5 and T6T7).   In 2010, she had an episode of limping and was referred to me.   MRI of the spine showed one focus and MRI of the brain showed multiple foci consistent with t multiple sclerosis.  There was one enhancing plaque in the left posterior frontal lobe.   She was started on Copaxone and noted mild lipoatrophy. Of note, she is HLA DR 1501 positive. She was clinically stable, she initially went to Copaxone 20 mg every other day in 2012 and then switched to 40 mg 3 times a week in  2014. In 2015 she switch to 40 mg twice a week.     REVIEW OF SYSTEMS: Constitutional: No fevers, chills, sweats, or change in appetite Eyes: No visual changes, double vision, eye pain Ear, nose and throat: No hearing loss, ear pain, nasal congestion, sore throat Cardiovascular: No chest pain, palpitations Respiratory: No shortness of breath at rest or with exertion.   No wheezes GastrointestinaI: No nausea, vomiting, diarrhea, abdominal pain, fecal incontinence Genitourinary: No dysuria, urinary retention.  Minimal frequency and minimal stress incontinence.  No nocturia. Musculoskeletal: No neck pain.   Improved Lower back pain Integumentary: No rash, pruritus, skin lesions Neurological: as above Psychiatric: No depression at this time.  No anxiety Endocrine: No palpitations, diaphoresis, change in appetite, change in weigh or increased thirst Hematologic/Lymphatic: No anemia, purpura, petechiae. Allergic/Immunologic: No itchy/runny eyes, nasal congestion, recent allergic reactions, rashes  ALLERGIES: Allergies  Allergen Reactions  . Erythromycin Nausea And Vomiting    HOME MEDICATIONS:  Current Outpatient Prescriptions:  .  acetaminophen (TYLENOL) 500 MG tablet, Take 1,000 mg by mouth every 6 (six) hours as needed., Disp: , Rfl:  .  cetirizine (ZYRTEC) 10 MG tablet, Take 10 mg by mouth daily., Disp: , Rfl: 4 .  cholecalciferol (VITAMIN D) 1000 UNITS tablet, Take 2,000 Units by mouth daily., Disp: , Rfl:  .  COPAXONE 40 MG/ML SOSY, Inject 40 mg into the skin 2 (two) times a week., Disp: 24 Syringe, Rfl: 3 .  Cranberry 125 MG TABS, Take 125 mg by mouth daily., Disp: , Rfl:  .  diphenhydrAMINE (SOMINEX) 25 MG tablet, Take 50 mg by mouth at bedtime as needed for sleep., Disp: , Rfl:  .  etodolac (LODINE) 400 MG tablet, Take 1 tablet (400 mg total) by mouth 2 (two) times daily., Disp: 180 tablet, Rfl: 0 .  fluticasone (FLONASE) 50 MCG/ACT nasal spray, , Disp: , Rfl: 2 .   hyoscyamine (ANASPAZ) 0.125 MG TBDP disintergrating tablet, Place 0.125 mg under the tongue., Disp: , Rfl:  .  JUNEL 1.5/30 1.5-30 MG-MCG tablet, Take 1 tablet by mouth daily., Disp: , Rfl: 3 .  loratadine-pseudoephedrine (CLARITIN-D 12-HOUR) 5-120 MG tablet, Take 1 tablet by mouth at bedtime as needed for allergies., Disp: , Rfl:  .  Melatonin 3 MG CAPS, Take 3 mg by mouth at bedtime., Disp: , Rfl:  .  Minoxidil (ROGAINE WOMENS) 5 % FOAM, Apply topically., Disp: , Rfl:  .  Multiple Vitamin (MULTIVITAMIN) tablet, Take 1 tablet by mouth daily., Disp: , Rfl:  .  polyethylene glycol (MIRALAX /  GLYCOLAX) packet, Take 17 g by mouth daily., Disp: , Rfl:  .  Rutin 50 MG TABS, Take by mouth., Disp: , Rfl:  .  simvastatin (ZOCOR) 10 MG tablet, Take 10 mg by mouth every evening., Disp: , Rfl: 1 .  vitamin B-12 (CYANOCOBALAMIN) 500 MCG tablet, Take 500 mcg by mouth 2 (two) times daily. Reported on 04/24/2015, Disp: , Rfl:  .  vitamin C (ASCORBIC ACID) 500 MG tablet, Take 1,000 mg by mouth daily. Reported on 04/24/2015, Disp: , Rfl:  .  Flaxseed, Linseed, (FLAXSEED OIL) 1200 MG CAPS, Take 1,200 mg by mouth daily. Reported on 04/24/2015, Disp: , Rfl:  .  traMADol (ULTRAM) 50 MG tablet, Take 1 tablet (50 mg total) by mouth 3 (three) times daily as needed. (Patient not taking: Reported on 12/24/2015), Disp: 90 tablet, Rfl: 0  PAST MEDICAL HISTORY: Past Medical History:  Diagnosis Date  . Headache   . Multiple sclerosis ([place])   . Vision abnormalities     PAST SURGICAL HISTORY: Past Surgical History:  Procedure Laterality Date  . KNEE ARTHROSCOPY Left    2006    FAMILY HISTORY: Family History  Problem Relation Age of Onset  . Hypertension Mother   . Ataxia Father   . CAD Father   . Parkinson's disease Father   . Bladder Cancer Father     SOCIAL HISTORY:  Social History   Social History  . Marital status: Married    Spouse name: N/A  . Number of children: N/A  . Years of education: N/A    Occupational History  . Glass blower/designer    Social History Main Topics  . Smoking status: Never Smoker  . Smokeless tobacco: Not on file  . Alcohol use 0.0 oz/week     Comment: 2-3 beers per week/fim  . Drug use: No  . Sexual activity: Not on file   Other Topics Concern  . Not on file   Social History Narrative  . No narrative on file     PHYSICAL EXAM  Vitals:   12/24/15 0835  BP: (!) 142/88  Pulse: 72  Resp: 16  Weight: 206 lb 8 oz (93.7 kg)  Height: '5\' 9"'  (1.753 m)    Body mass index is 30.49 kg/m.   General: The patient is well-developed and well-nourished and in no acute distress  Musculoskeletal:     Right knee has normal range of motion but is tender to deep palpation.    Neurologic Exam  Mental status: The patient is alert and oriented x 3 at the time of the examination. The patient has apparent normal recent and remote memory, with an apparently normal attention span and concentration ability.   Speech is normal.  Cranial nerves: Extraocular movements are full.    There is good facial sensation to soft touch bilaterally.Facial strength is normal.  Trapezius and sternocleidomastoid strength is normal. No dysarthria is noted.  No obvious hearing deficits are noted.  Motor:  Muscle bulk is normal.   Tone is increased in R>L legs. Strength is  5 / 5 in all 4 extremities with 4++/5 at right ankle/EHL.   Sensory: Sensory testing is intact to pinprick, soft touch and vibration sensation in arms but she has decreased vibration and touch in right foot.  Coordination: Cerebellar testing reveals good finger-nose-finger and heel-to-shin bilaterally.  Gait and station: Station is normal.   Gait shows right foot drop and is mildly wide. Tandem gait is wide. Romberg is negative.   Reflexes: Deep  tendon reflexes are increased on both legs with spread at the knees and non-sustained clonus at right ankle.        DIAGNOSTIC DATA (LABS, IMAGING, TESTING) - I reviewed  patient records, labs, notes, testing and imaging myself where available.     ASSESSMENT AND PLAN  Multiple sclerosis ([place]) - Plan: Amb ref to Medical Nutrition Therapy-MNT  Class 1 obesity due to excess calories without serious comorbidity with body mass index (BMI) of 30.0 to 30.9 in adult - Plan: Amb ref to Medical Nutrition Therapy-MNT  Right foot drop  Other fatigue  Multiple joint pain   1.   Continue Copaxone. 2.   Refer for dietician consult to help lose weight 3.   NSAID for pain 4.   She will return to see me in 6 months or sooner if she has new or worsening neurologic symptoms.   Around the time of her next visit, we will check an MRI of the brain to assess for subclinical progression. If this is occurring we will need to consider a different disease modifying therapy.   Evianna Chandran A. Felecia Shelling, MD, PhD 97/10/4799, 6:55 AM Certified in Neurology, Clinical Neurophysiology, Sleep Medicine, Pain Medicine and Neuroimaging  [facility] [address] [place], [place] [address] [phone]

## 2016-01-02 DIAGNOSIS — J3089 Other allergic rhinitis: Secondary | ICD-10-CM | POA: Diagnosis not present

## 2016-01-02 DIAGNOSIS — R062 Wheezing: Secondary | ICD-10-CM | POA: Diagnosis not present

## 2016-01-23 ENCOUNTER — Telehealth: Payer: Self-pay | Admitting: *Deleted

## 2016-01-23 MED ORDER — COPAXONE 40 MG/ML ~~LOC~~ SOSY: 40.0000 mg | PREFILLED_SYRINGE | SUBCUTANEOUS | 3 refills | Status: DC

## 2016-01-23 NOTE — Telephone Encounter (Signed)
Copaxone rx. escribed to [facility] per faxed request/fim

## 2016-03-30 ENCOUNTER — Telehealth: Payer: Self-pay | Admitting: Neurology

## 2016-03-30 DIAGNOSIS — G35 Multiple sclerosis: Secondary | ICD-10-CM

## 2016-03-30 DIAGNOSIS — E6609 Other obesity due to excess calories: Secondary | ICD-10-CM

## 2016-03-30 DIAGNOSIS — Z6830 Body mass index (BMI) 30.0-30.9, adult: Principal | ICD-10-CM

## 2016-03-30 NOTE — Telephone Encounter (Signed)
For the last week and a half patient has had numbness, tingling and pain in right shoulder, arm and hand. Please call and discuss.

## 2016-03-30 NOTE — Addendum Note (Signed)
Addended by: Candis Schatz I on: 03/30/2016 02:41 PM   Modules accepted: Orders

## 2016-03-30 NOTE — Telephone Encounter (Signed)
I have spoken with Lindsay Carter this afternoon.  She c/o intermittent numbness/tingling right fingertips, f/a, elbow, onset about 2 weeks ago.  Worse when she wakes up in the morning, and with activities such as applying makeup, using computer. Pending appt. in May.  She will continue to monitor and if sx. worsen, will call back for sooner appt/fim

## 2016-04-01 NOTE — Telephone Encounter (Signed)
Called Nutrition / Diabetes at [facility] to make sure they received order they did. They will call patient to schedule.  Called and left patient a message with telephone [phone] and relayed if she has not hurd from them in one week to call them or call me back. Thanks Annabelle Harman.

## 2016-04-01 NOTE — Telephone Encounter (Signed)
Noted/fim 

## 2016-05-13 ENCOUNTER — Other Ambulatory Visit: Payer: Self-pay | Admitting: Neurology

## 2016-05-18 ENCOUNTER — Ambulatory Visit: Payer: BLUE CROSS/BLUE SHIELD | Admitting: Registered"

## 2016-05-19 ENCOUNTER — Other Ambulatory Visit: Payer: Self-pay

## 2016-05-19 MED ORDER — COPAXONE 40 MG/ML ~~LOC~~ SOSY: 40.0000 mg | PREFILLED_SYRINGE | SUBCUTANEOUS | 3 refills | Status: DC

## 2016-05-20 ENCOUNTER — Telehealth: Payer: Self-pay | Admitting: Neurology

## 2016-05-20 NOTE — Telephone Encounter (Signed)
Joni Reining from [employer] [phone] opt 1 is asking for a Rx for a new auto jack (because pt does not like to inject the COPAXONE 40 MG/ML SOSY manually).Rep said last one was from 2014.  Joni Reining also said that a fax was sent over on 4-2 with a request as well.

## 2016-05-21 NOTE — Telephone Encounter (Signed)
I have spoken with Joni Reining at [facility], and per her request, left verbal order for 2 autojects, to be used with Copaxone, with PRN r/f for one yr/fim

## 2016-06-23 ENCOUNTER — Encounter: Payer: Self-pay | Admitting: Neurology

## 2016-06-23 ENCOUNTER — Ambulatory Visit (INDEPENDENT_AMBULATORY_CARE_PROVIDER_SITE_OTHER): Payer: BLUE CROSS/BLUE SHIELD | Admitting: Neurology

## 2016-06-23 VITALS — BP 156/91 | HR 109 | Resp 16 | Ht 69.0 in | Wt 209.0 lb

## 2016-06-23 DIAGNOSIS — M21371 Foot drop, right foot: Secondary | ICD-10-CM | POA: Diagnosis not present

## 2016-06-23 DIAGNOSIS — G373 Acute transverse myelitis in demyelinating disease of central nervous system: Secondary | ICD-10-CM | POA: Diagnosis not present

## 2016-06-23 DIAGNOSIS — R5383 Other fatigue: Secondary | ICD-10-CM

## 2016-06-23 DIAGNOSIS — G35 Multiple sclerosis: Secondary | ICD-10-CM | POA: Diagnosis not present

## 2016-06-23 NOTE — Progress Notes (Signed)
[facility]  PATIENT: Lindsay Carter DOB: [birth year]  REFERRING DOCTOR OR PCP:  Milagros Evener [facility])   SOURCE: patient and [facility] records                    _________________________________   HISTORICAL  CHIEF COMPLAINT:  Chief Complaint  Patient presents with  . Multiple Sclerosis    Sts. she continues to tolerate Copaxone well.  Sts. she continues to battle fatigue. Has an appt. with a nutritionist in 2 weeks/fim    HISTORY OF PRESENT ILLNESS:  Lindsay Carter is a 46 year old with relapsing remitting multiple sclerosis.   MS:   She is on Copaxone and tolerates it well.   She has not had any definite exacerbations.  Her last MRI 09/03/2014 showed Chronic demyelinating plaques consistent with MS in the hemispheres with a relatively low plaque burden. There were no enhancing foci.   A thoracic spine from 2009 shows plaque within the spinal cord adjacent to T4T5 and T6-T7.    Gait/strength/sensation: She has right leg spasticity and weakness affecting her gait.  She stumbles some and has occasional falls. She feels her gait is worse later in the day. She has a right foot drop that started with her initial transverse myelitis in 2009.    She is thinking about getting an AFO brace but would like to hold off right now.   Her foot drop is worse with longer distances.    She has no arm weakness.   She notes tingling in her right hand at times that will occur some days and some night.   She denies any significant numbness in either leg or arm.      Bladder/bowel:  Bladder function is stable with mild urinary frequency and urgency. She does not have any recent incontinence. Three times in the past she has had bowel urgency incontinence events.   Vision: She denies any difficulty with vision. There is no diplopia or eye pain.  Fatigue/sleep: She notes physical > cognitive fatigue.   This is often mild in the morning but may worsen later in the day.  .   In the past, she tried modafinil.    Modafinil caused  headache and she stopped. It had helped.   She had some insomnia with early awakening some days but she falls asleep easily.  Mood/cognition. She denies any significant depression or anxiety. She has not had any major cognitive dysfunction but feels processing speed has slowed some.  She uses lists as reminders.  Joint pain:   She repors the multiple joint pain is doing better.    Rheum labs were negative in 2017.    She is on Lodine  She filled the tramadol script but has not taken any.    MS History:   Prior to 2010, she presented with several episodes of numbness and MRI of the spine was reportedly normal (however on my recent review there appear to be 2 thoracic plaques at T4T5 and T6T7).   In 2010, she had an episode of limping and was referred to me.   MRI of the spine showed one focus and MRI of the brain showed multiple foci consistent with t multiple sclerosis.  There was one enhancing plaque in the left posterior frontal lobe.   She was started on Copaxone and noted mild lipoatrophy. Of note, she is HLA DR 1501 positive. She was clinically stable, she initially went to Copaxone 20 mg every other day  in 2012 and then switched to 40 mg 3 times a week in 2014. In 2015 she switch to 40 mg twice a week.     REVIEW OF SYSTEMS: Constitutional: No fevers, chills, sweats, or change in appetite.   Has fatigue Eyes: No visual changes, double vision, eye pain Ear, nose and throat: No hearing loss, ear pain, nasal congestion, sore throat Cardiovascular: No chest pain, palpitations Respiratory: No shortness of breath at rest or with exertion.   No wheezes GastrointestinaI: No nausea, vomiting, diarrhea, abdominal pain, fecal incontinence Genitourinary: No dysuria, urinary retention.  Minimal frequency and minimal stress incontinence.  No nocturia. Musculoskeletal: No neck pain.  Mild Lower back pain Integumentary: No rash, pruritus, skin  lesions Neurological: as above Psychiatric: No depression at this time.  No anxiety Endocrine: No palpitations, diaphoresis, change in appetite, change in weigh or increased thirst Hematologic/Lymphatic: No anemia, purpura, petechiae. Allergic/Immunologic: No itchy/runny eyes, nasal congestion, recent allergic reactions, rashes  ALLERGIES: Allergies  Allergen Reactions  . Erythromycin Nausea And Vomiting    HOME MEDICATIONS:  Current Outpatient Prescriptions:  .  acetaminophen (TYLENOL) 500 MG tablet, Take 1,000 mg by mouth every 6 (six) hours as needed., Disp: , Rfl:  .  cetirizine (ZYRTEC) 10 MG tablet, Take 10 mg by mouth daily., Disp: , Rfl: 4 .  cholecalciferol (VITAMIN D) 1000 UNITS tablet, Take 2,000 Units by mouth daily., Disp: , Rfl:  .  COPAXONE 40 MG/ML SOSY, Inject 40 mg into the skin 2 (two) times a week., Disp: 24 Syringe, Rfl: 3 .  Cranberry 125 MG TABS, Take 125 mg by mouth daily., Disp: , Rfl:  .  etodolac (LODINE) 400 MG tablet, TAKE 1 TABLET (400 MG TOTAL) BY MOUTH 2 (TWO) TIMES DAILY., Disp: 180 tablet, Rfl: 0 .  fluticasone (FLONASE) 50 MCG/ACT nasal spray, , Disp: , Rfl: 2 .  hyoscyamine (ANASPAZ) 0.125 MG TBDP disintergrating tablet, Place 0.125 mg under the tongue., Disp: , Rfl:  .  JUNEL 1.5/30 1.5-30 MG-MCG tablet, Take 1 tablet by mouth daily., Disp: , Rfl: 3 .  loratadine-pseudoephedrine (CLARITIN-D 12-HOUR) 5-120 MG tablet, Take 1 tablet by mouth at bedtime as needed for allergies., Disp: , Rfl:  .  Melatonin 3 MG CAPS, Take 3 mg by mouth at bedtime., Disp: , Rfl:  .  Minoxidil (ROGAINE WOMENS) 5 % FOAM, Apply topically., Disp: , Rfl:  .  Multiple Vitamin (MULTIVITAMIN) tablet, Take 1 tablet by mouth daily., Disp: , Rfl:  .  polyethylene glycol (MIRALAX / GLYCOLAX) packet, Take 17 g by mouth daily., Disp: , Rfl:  .  Rutin 50 MG TABS, Take by mouth., Disp: , Rfl:  .  simvastatin (ZOCOR) 10 MG tablet, Take 10 mg by mouth every evening., Disp: , Rfl: 1 .   traMADol (ULTRAM) 50 MG tablet, Take 1 tablet (50 mg total) by mouth 3 (three) times daily as needed., Disp: 90 tablet, Rfl: 0 .  vitamin B-12 (CYANOCOBALAMIN) 500 MCG tablet, Take 500 mcg by mouth 2 (two) times daily. Reported on 04/24/2015, Disp: , Rfl:  .  vitamin C (ASCORBIC ACID) 500 MG tablet, Take 1,000 mg by mouth daily. Reported on 04/24/2015, Disp: , Rfl:   PAST MEDICAL HISTORY: Past Medical History:  Diagnosis Date  . Headache   . Multiple sclerosis ([place])   . Vision abnormalities     PAST SURGICAL HISTORY: Past Surgical History:  Procedure Laterality Date  . KNEE ARTHROSCOPY Left    2006    FAMILY HISTORY: Family History  Problem Relation Age of Onset  . Hypertension Mother   . Ataxia Father   . CAD Father   . Parkinson's disease Father   . Bladder Cancer Father     SOCIAL HISTORY:  Social History   Social History  . Marital status: Married    Spouse name: N/A  . Number of children: N/A  . Years of education: N/A   Occupational History  . Glass blower/designer    Social History Main Topics  . Smoking status: Never Smoker  . Smokeless tobacco: Never Used  . Alcohol use 0.0 oz/week     Comment: 2-3 beers per week/fim  . Drug use: No  . Sexual activity: Not on file   Other Topics Concern  . Not on file   Social History Narrative  . No narrative on file     PHYSICAL EXAM  Vitals:   06/23/16 0817  BP: (!) 156/91  Pulse: (!) 109  Resp: 16  Weight: 209 lb (94.8 kg)  Height: '5\' 9"'  (1.753 m)    Body mass index is 30.86 kg/m.   General: The patient is well-developed and well-nourished and in no acute distress  Musculoskeletal:     Right knee has normal range of motion but is tender to deep palpation.    Neurologic Exam  Mental status: The patient is alert and oriented x 3 at the time of the examination. The patient has apparent normal recent and remote memory, with an apparently normal attention span and concentration ability.   Speech is  normal.  Cranial nerves: Extraocular movements are full.    There is good facial sensation to soft touch bilaterally.Facial strength is normal.  Trapezius and sternocleidomastoid strength is normal. No dysarthria is noted.  No obvious hearing deficits are noted.  Motor:  Muscle bulk is normal.   Tone is increased in R>L legs. Strength is  5 / 5 in the arms and left leg .  She has 4++/5 at right ankle/EHL.   Sensory: Sensory testing is intact to pinprick, soft touch and vibration sensation in arms but she has decreased vibration and touch in right foot.   There is normal sensation in the thenar eminences bilaterally.  Coordination: Cerebellar testing reveals good finger-nose-finger and heel-to-shin bilaterally.  Gait and station: Station is normal.   She has a right foot drop in the gait is mildly wide. Her tandem gait is wide. Romberg is negative.   Reflexes: Deep tendon reflexes are increased on both legs with spread at the knees and non-sustained clonus at right ankle.      Other:   There are no Tinel's or Phalen signs at the wrists.    DIAGNOSTIC DATA (LABS, IMAGING, TESTING) - I reviewed patient records, labs, notes, testing and imaging myself where available.     ASSESSMENT AND PLAN  Multiple sclerosis ([place]) - Plan: MR BRAIN W WO CONTRAST, MR CERVICAL SPINE W WO CONTRAST  Right foot drop - Plan: MR BRAIN W WO CONTRAST, MR CERVICAL SPINE W WO CONTRAST  Other fatigue  Transverse myelitis ([place]) - Plan: MR BRAIN W WO CONTRAST, MR CERVICAL SPINE W WO CONTRAST   1.   She will continue Copaxone. I will check an MRI of the brain and cervical spine to better determine if she is having any subclinical progression of her multiple sclerosis and also to determine if her new right arm symptoms are due to MS or to another etiology. If subclinical progression as noted, we will need to consider  a more efficacious medication. 2.   continue NSAID for pain 3.   If the right hand symptoms  worsen, consider an NCV/EMG if an etiology is not determined on the MRI 4.   She will return to see me in 6 months or sooner if she has new or worsening neurologic symptoms.    Shakirra Buehler A. Felecia Shelling, MD, PhD 10/20/1738, 8:14 AM Certified in Neurology, Clinical Neurophysiology, Sleep Medicine, Pain Medicine and Neuroimaging  [facility] [address] [place], [place] [address] [phone]

## 2016-07-06 ENCOUNTER — Ambulatory Visit: Payer: BLUE CROSS/BLUE SHIELD | Admitting: Dietician

## 2016-07-07 NOTE — Progress Notes (Signed)
Nutrition: Lindsay Carter (07/06/2016)  Aryona is a spouse of an Music therapist.  Works for [employer] in [place].  CC: Wants assistance with weight loss.  Seeking help with meals and snacks. Wanting to learn " limits on fast foods, fruits, and protein."  HX: Comes with history of weight gain over last few years.  Has a hx of MS.  She is very guarded in giving out health information.  She is fearful of being fired if her employer should learn of her MS.  She has a RT food drop, that she describes as arthritis in her knee.   She is seeking to stay as active as possible and to maintain active employment as long as possible.  Assessment: 8:00 am Breakfast: 2 -3 days per week will have [place] plain biscuit or a ham biscuit.  Other days will have 2 frozen waffles.  Drinks a Starbucks iced coffee that contains cream and sugar. 9:30-10:00 am Has a piece of fruit or handful of nuts. 12:00 noon: lunch Usually a Lean Cuisine or a gmo meal of beans and rice or Carter have a Cobb salad with grilled chicken.  (Tries to limit preservatives/additives in foods.) 2-3:00 pm will have a small fruit or handful of nuts.. 6-7:00pm Dinner Will have fresh pasta with veggies such as asparagus and tomatoes.  Or ma have baked salmon or may do a shrimp wrap or other Asian dishes.  Notes that with limited activity will easily become over heated.  Seek exercise that would accommodate her balance issues and not lead to extreme over heating.  Drinks 90+ oz of water per day.  Sips throughout the day.  Experiences issues with either diarrhea or constipation.. These vary.  Tries to cook at home and to carry for lunch as much as possible.  Recommendations.: + Aim for 1400-1500 calories per day.  Use food labels or Calorieking.com as resources. + Keep a food diary for 1-2 weekdays and 1 day each weekend.   + Aim for:,Breakfast= 330 calorie, Snack= 200 calories, Lunch= 400 calories, Snack = 200 calories and Dinner= 400 calories. +  Use food labels and carb/calorie counting sheet. + Measure servings, especially nuts. + Use proteins such as fish, lean beef and poultry, tofu.   +Look up calorie counts prior to going to dinner.  If local establishment, then look at comparable chain restaurant dish. + F/U 4-6 weeks and bring food diary and issues.  Personal Goals: + Try to learn more about what I'm eating. +Try to have regular protein intake. + Learn of healthier proteins and the carb ratio for me.  Education Materials: + Carb and Calorie Counting Sheet with Food Groups. + My Plate handout. + Consult Sheet

## 2016-07-20 ENCOUNTER — Other Ambulatory Visit: Payer: BLUE CROSS/BLUE SHIELD

## 2016-07-25 ENCOUNTER — Other Ambulatory Visit: Payer: Self-pay | Admitting: Neurology

## 2016-08-11 ENCOUNTER — Ambulatory Visit
Admission: RE | Admit: 2016-08-11 | Discharge: 2016-08-11 | Disposition: A | Discharge status: Home / Self Care | Payer: BLUE CROSS/BLUE SHIELD | Source: Ambulatory Visit | Attending: Neurology | Admitting: Neurology

## 2016-08-11 DIAGNOSIS — R51 Headache: Secondary | ICD-10-CM | POA: Diagnosis not present

## 2016-08-11 DIAGNOSIS — M50223 Other cervical disc displacement at C6-C7 level: Secondary | ICD-10-CM | POA: Diagnosis not present

## 2016-08-11 DIAGNOSIS — M50222 Other cervical disc displacement at C5-C6 level: Secondary | ICD-10-CM | POA: Diagnosis not present

## 2016-08-11 DIAGNOSIS — G35D Multiple sclerosis, unspecified: Secondary | ICD-10-CM

## 2016-08-11 DIAGNOSIS — G373 Acute transverse myelitis in demyelinating disease of central nervous system: Secondary | ICD-10-CM

## 2016-08-11 DIAGNOSIS — M21371 Foot drop, right foot: Secondary | ICD-10-CM | POA: Diagnosis not present

## 2016-08-11 DIAGNOSIS — G35 Multiple sclerosis: Secondary | ICD-10-CM | POA: Diagnosis not present

## 2016-08-11 MED ORDER — GADOBENATE DIMEGLUMINE 529 MG/ML IV SOLN
19.0000 mL | Freq: Once | INTRAVENOUS | Status: AC | PRN
  Administered 2016-08-11: 19 mL via INTRAVENOUS

## 2016-08-13 ENCOUNTER — Telehealth: Payer: Self-pay | Admitting: *Deleted

## 2016-08-13 NOTE — Telephone Encounter (Signed)
I have spoken with Puneet this morning and per RAS, reviewed MRI results as below.  She verbalized understanding of same/fim

## 2016-08-13 NOTE — Telephone Encounter (Signed)
-----   Message from Asa Lente, MD sent at 08/11/2016  6:43 PM EDT ----- Please let her know that the MRI of the brain showed the old MS lesions but there was nothing new compared to her previous MRI.  The MRI of the cervical spine shows 1 small focus to the right that likely explains her right-sided weakness.   She also has a facet bone spur to the left at the top of the neck and a disc protrusion at C5-C6. Neither of these lead to any nerve root compression.

## 2016-08-26 DIAGNOSIS — D225 Melanocytic nevi of trunk: Secondary | ICD-10-CM | POA: Diagnosis not present

## 2016-10-20 DIAGNOSIS — G35 Multiple sclerosis: Secondary | ICD-10-CM | POA: Diagnosis not present

## 2016-10-20 DIAGNOSIS — Z23 Encounter for immunization: Secondary | ICD-10-CM | POA: Diagnosis not present

## 2016-10-20 DIAGNOSIS — Z0000 Encounter for general adult medical examination without abnormal findings: Secondary | ICD-10-CM | POA: Diagnosis not present

## 2016-10-20 DIAGNOSIS — E7800 Pure hypercholesterolemia, unspecified: Secondary | ICD-10-CM | POA: Diagnosis not present

## 2016-10-24 ENCOUNTER — Other Ambulatory Visit: Payer: Self-pay | Admitting: Neurology

## 2016-11-03 ENCOUNTER — Ambulatory Visit: Payer: BLUE CROSS/BLUE SHIELD | Admitting: Dietician

## 2016-11-03 NOTE — Progress Notes (Signed)
Nutrition:  11/03/2016  CC: F/U visit for weight loss.  HT: 69 in Weight (with shoes): 11/03/2016 198.8 lbs. BMI: 29.4  Lindsay Carter has lost 11>0 lbs since her appoint on 07/06/2016.  She notes that she has been trying to follow the calorie recommendations, and reading food labels.  Has continued with the 3 meals and 2 snacks.  Has made adjustments and found that "it was not as hard as I thought.  I've made changes but they don't appear to be as big a problem as they Vyron Fronczak sound. I can do this over the long term.  I try to follow the changes closely during the week and I will give myself a small reward on Friday or the weekend.  I will eat a meal out and have maybe a large salad and a slice of pizza.  I eat with friends and still feel like I'm not being denied foods that I enjoy."  Had her annual physical recently, and reports that her MD was pleased with her efforts and her weight loss. Noted that this improves her overall health.  Recommended: 1. Continuing to count calories and monitor food labels. 2. Keep doing the things that are working for you. 3. Montior the issues of constipation and your period. 4. See me back in 3 months or as needed. 5. Ass as tolerated increased activity.   Personal Goals: 1. Keep doing what I've been doing. 2. Maintain a weight loss pattern.  FU: 3 months or as needed.  [name], RN, RD, LDN

## 2016-12-29 ENCOUNTER — Encounter: Payer: Self-pay | Admitting: Neurology

## 2016-12-29 ENCOUNTER — Other Ambulatory Visit: Payer: Self-pay

## 2016-12-29 ENCOUNTER — Ambulatory Visit: Payer: BLUE CROSS/BLUE SHIELD | Admitting: Neurology

## 2016-12-29 VITALS — BP 128/83 | HR 109 | Resp 16 | Ht 69.0 in | Wt 199.0 lb

## 2016-12-29 DIAGNOSIS — G35 Multiple sclerosis: Secondary | ICD-10-CM | POA: Diagnosis not present

## 2016-12-29 DIAGNOSIS — R5383 Other fatigue: Secondary | ICD-10-CM | POA: Diagnosis not present

## 2016-12-29 DIAGNOSIS — M21371 Foot drop, right foot: Secondary | ICD-10-CM | POA: Diagnosis not present

## 2016-12-29 DIAGNOSIS — G373 Acute transverse myelitis in demyelinating disease of central nervous system: Secondary | ICD-10-CM | POA: Diagnosis not present

## 2016-12-29 NOTE — Progress Notes (Signed)
[facility]  PATIENT: Lindsay Carter DOB: 03-Mar-1970  REFERRING DOCTOR OR PCP:  Milagros Evener [facility])   SOURCE: patient and [facility] records                    _________________________________   HISTORICAL  CHIEF COMPLAINT:  Chief Complaint  Patient presents with  . Multiple Sclerosis    Sts. Lindsay Carter continues to tolerate Copaxone well.  Believes right sided weakness is about the same./fim    HISTORY OF PRESENT ILLNESS:  Lindsay Carter is a 46 year old with relapsing remitting multiple sclerosis.   Update 12/29/2016: Lindsay Carter is on Copaxone 40 mg twice a week. Lindsay Carter tolerates well and has had no exacerbations. Lindsay Carter had MRIs in 08/04/2016 of the brain and spinal cord. On the brain Lindsay Carter has multiple T2/FLAIR hyperintense foci consistent with MS. There is no change when compared to 2016 MRI. Cervical spine, there is a small focus to the right at C4 in the spinal cord otherwise appears normal. This is a chronic lesion. Images are personally reviewed with Lindsay Carter.    Lindsay Carter notes no change in gait or strength.   Lindsay Carter has some right sided numbness but no sustained tingling x years.     Vision is fine.   Bladder if fine.   Fatigue is doing well.    Lindsay Carter sleeps well most nights.   Lindsay Carter denies problems with mood or cognition. Lindsay Carter continues to work full-time.  From 06/23/2016:      MS:   Lindsay Carter is on Copaxone and tolerates it well.   Lindsay Carter has not had any definite exacerbations.  Her last MRI 09/03/2014 showed Chronic demyelinating plaques consistent with MS in the hemispheres with a relatively low plaque burden. There were no enhancing foci.   A thoracic spine from 2009 shows plaque within the spinal cord adjacent to T4T5 and T6-T7.    Gait/strength/sensation: Lindsay Carter has right leg spasticity and weakness affecting her gait.  Lindsay Carter stumbles some and has occasional falls. Lindsay Carter feels her gait is worse later in the day. Lindsay Carter has a right foot drop that started with her initial  transverse myelitis in 2009.    Lindsay Carter is thinking about getting an AFO brace but would like to hold off right now.   Her foot drop is worse with longer distances.    Lindsay Carter has no arm weakness.   Lindsay Carter notes tingling in her right hand at times that will occur some days and some night.   Lindsay Carter denies any significant numbness in either leg or arm.      Bladder/bowel:  Bladder function is stable with mild urinary frequency and urgency. Lindsay Carter does not have any recent incontinence. Three times in the past Lindsay Carter has had bowel urgency incontinence events.   Vision: Lindsay Carter denies any difficulty with vision. There is no diplopia or eye pain.  Fatigue/sleep: Lindsay Carter notes physical > cognitive fatigue.   This is often mild in the morning but may worsen later in the day. .   In the past, Lindsay Carter tried modafinil.    Modafinil caused  headache and Lindsay Carter stopped. It had helped.   Lindsay Carter had some insomnia with early awakening some days but Lindsay Carter falls asleep easily.  Mood/cognition. Lindsay Carter denies any significant depression or anxiety. Lindsay Carter has not had any major cognitive dysfunction but feels processing speed has slowed some.  Lindsay Carter uses lists as reminders.  Joint pain:   Lindsay Carter repors the multiple joint pain is doing better.  Rheum labs were negative in 2017.    Lindsay Carter is on Lodine  Lindsay Carter filled the tramadol script but has not taken any.    MS History:   Prior to 2010, Lindsay Carter presented with several episodes of numbness and MRI of the spine was reportedly normal (however on my recent review there appear to be 2 thoracic plaques at T4T5 and T6T7).   In 2010, Lindsay Carter had an episode of limping and was referred to me.   MRI of the spine showed one focus and MRI of the brain showed multiple foci consistent with t multiple sclerosis.  There was one enhancing plaque in the left posterior frontal lobe.   Lindsay Carter was started on Copaxone and noted mild lipoatrophy. Of note, Lindsay Carter is HLA DR 1501 positive. Lindsay Carter was clinically stable, Lindsay Carter initially went to Copaxone 20 mg every other day  in 2012 and then switched to 40 mg 3 times a week in 2014. In 2015 Lindsay Carter switch to 40 mg twice a week.     REVIEW OF SYSTEMS: Constitutional: No fevers, chills, sweats, or change in appetite.   Has fatigue Eyes: No visual changes, double vision, eye pain Ear, nose and throat: No hearing loss, ear pain, nasal congestion, sore throat Cardiovascular: No chest pain, palpitations Respiratory: No shortness of breath at rest or with exertion.   No wheezes GastrointestinaI: No nausea, vomiting, diarrhea, abdominal pain, fecal incontinence Genitourinary: No dysuria, urinary retention.  Minimal frequency and minimal stress incontinence.  No nocturia. Musculoskeletal: No neck pain.  Mild Lower back pain Integumentary: No rash, pruritus, skin lesions Neurological: as above Psychiatric: No depression at this time.  No anxiety Endocrine: No palpitations, diaphoresis, change in appetite, change in weigh or increased thirst Hematologic/Lymphatic: No anemia, purpura, petechiae. Allergic/Immunologic: No itchy/runny eyes, nasal congestion, recent allergic reactions, rashes  ALLERGIES: Allergies  Allergen Reactions  . Erythromycin Nausea And Vomiting    HOME MEDICATIONS:  Current Outpatient Medications:  .  acetaminophen (TYLENOL) 500 MG tablet, Take 1,000 mg by mouth every 6 (six) hours as needed., Disp: , Rfl:  .  cetirizine (ZYRTEC) 10 MG tablet, Take 10 mg by mouth daily., Disp: , Rfl: 4 .  cholecalciferol (VITAMIN D) 1000 UNITS tablet, Take 2,000 Units by mouth daily., Disp: , Rfl:  .  COPAXONE 40 MG/ML SOSY, Inject 40 mg into the skin 2 (two) times a week., Disp: 24 Syringe, Rfl: 3 .  Cranberry 125 MG TABS, Take 125 mg by mouth daily., Disp: , Rfl:  .  etodolac (LODINE) 400 MG tablet, TAKE 1 TABLET (400 MG TOTAL) BY MOUTH 2 (TWO) TIMES DAILY., Disp: 180 tablet, Rfl: 0 .  fluticasone (FLONASE) 50 MCG/ACT nasal spray, , Disp: , Rfl: 2 .  hyoscyamine (ANASPAZ) 0.125 MG TBDP disintergrating  tablet, Place 0.125 mg under the tongue., Disp: , Rfl:  .  JUNEL 1.5/30 1.5-30 MG-MCG tablet, Take 1 tablet by mouth daily., Disp: , Rfl: 3 .  loratadine-pseudoephedrine (CLARITIN-D 12-HOUR) 5-120 MG tablet, Take 1 tablet by mouth at bedtime as needed for allergies., Disp: , Rfl:  .  Melatonin 3 MG CAPS, Take 3 mg by mouth at bedtime., Disp: , Rfl:  .  Minoxidil (ROGAINE WOMENS) 5 % FOAM, Apply topically., Disp: , Rfl:  .  Multiple Vitamin (MULTIVITAMIN) tablet, Take 1 tablet by mouth daily., Disp: , Rfl:  .  polyethylene glycol (MIRALAX / GLYCOLAX) packet, Take 17 g by mouth daily., Disp: , Rfl:  .  Rutin 50 MG TABS, Take by mouth., Disp: ,  Rfl:  .  simvastatin (ZOCOR) 10 MG tablet, Take 10 mg by mouth every evening., Disp: , Rfl: 1 .  vitamin C (ASCORBIC ACID) 500 MG tablet, Take 1,000 mg by mouth daily. Reported on 04/24/2015, Disp: , Rfl:  .  traMADol (ULTRAM) 50 MG tablet, Take 1 tablet (50 mg total) by mouth 3 (three) times daily as needed., Disp: 90 tablet, Rfl: 0 .  vitamin B-12 (CYANOCOBALAMIN) 500 MCG tablet, Take 500 mcg by mouth 2 (two) times daily. Reported on 04/24/2015, Disp: , Rfl:   PAST MEDICAL HISTORY: Past Medical History:  Diagnosis Date  . Headache   . Multiple sclerosis ([place])   . Vision abnormalities     PAST SURGICAL HISTORY: Past Surgical History:  Procedure Laterality Date  . KNEE ARTHROSCOPY Left    2006    FAMILY HISTORY: Family History  Problem Relation Age of Onset  . Hypertension Mother   . Ataxia Father   . CAD Father   . Parkinson's disease Father   . Bladder Cancer Father     SOCIAL HISTORY:  Social History   Socioeconomic History  . Marital status: Married    Spouse name: Not on file  . Number of children: Not on file  . Years of education: Not on file  . Highest education level: Not on file  Social Needs  . Financial resource strain: Not on file  . Food insecurity - worry: Not on file  . Food insecurity - inability: Not on file  .  Transportation needs - medical: Not on file  . Transportation needs - non-medical: Not on file  Occupational History  . Occupation: Glass blower/designer  Tobacco Use  . Smoking status: Never Smoker  . Smokeless tobacco: Never Used  Substance and Sexual Activity  . Alcohol use: Yes    Alcohol/week: 0.0 oz    Comment: 2-3 beers per week/fim  . Drug use: No  . Sexual activity: Not on file  Other Topics Concern  . Not on file  Social History Narrative  . Not on file     PHYSICAL EXAM  Vitals:   12/29/16 0833  BP: 128/83  Pulse: (!) 109  Resp: 16  Weight: 199 lb (90.3 kg)  Height: '5\' 9"'  (1.753 m)    Body mass index is 29.39 kg/m.   General: The patient is well-developed and well-nourished and in no acute distress  Musculoskeletal:     Right knee has normal range of motion but is tender to deep palpation.    Neurologic Exam  Mental status: The patient is alert and oriented x 3 at the time of the examination. The patient has apparent normal recent and remote memory, with an apparently normal attention span and concentration ability.   Speech is normal.  Cranial nerves: Extraocular movements are full.    There is good facial sensation to soft touch bilaterally.Facial strength is normal.  Trapezius and sternocleidomastoid strength is normal. No dysarthria is noted.  No obvious hearing deficits are noted.  Motor:  Muscle bulk is normal.   Muscle tone is increased in the legs, right much more than left. Strength is 5/5 in the arms and the left leg. Strength is 4+/5 at the right ankle and toes.   Sensory:  There was intact sensation to touch and vibration in the arms and legs.   There is normal sensation in the thenar eminences bilaterally.    No Tinel signs.  Coordination: Cerebellar testing reveals good  finger-nose-finger and heel-to-shin bilaterally.  Gait and station: Station is normal.   Lindsay Carter has a right foot drop in the gait is mildly wide. Her tandem gait is wide. Romberg is negative.   Reflexes: Deep tendon reflexes are increased on both legs with spread at the knees and non-sustained clonus at right ankle.      Other:   There are no Tinel's or Phalen signs at the wrists.    DIAGNOSTIC DATA (LABS, IMAGING, TESTING) - I reviewed patient records, labs, notes, testing and imaging myself where available.     ASSESSMENT AND PLAN  Multiple sclerosis (H[facility])  Right foot drop  Other fatigue  Transverse myelitis ([place])   1.   Continue Copaxone at the current dose. We discussed symptoms of relapses Lindsay Carter will give [place] a call if any new symptoms occur.  2.   continue NSAID for pain.   Try to remain active. 3.   If the right hand symptoms worsen, consider an NCV/EMG 4.   Lindsay Carter will return to see me in 6 months or sooner if Lindsay Carter has new or worsening neurologic symptoms.    Jiro Kiester A. Felecia Shelling, MD, PhD 06/00/4599, 7:74 AM Certified in Neurology, Clinical Neurophysiology, Sleep Medicine, Pain Medicine and Neuroimaging  [facility] [address] [place], [place] [address] [phone]

## 2016-12-31 DIAGNOSIS — R062 Wheezing: Secondary | ICD-10-CM | POA: Diagnosis not present

## 2016-12-31 DIAGNOSIS — J3089 Other allergic rhinitis: Secondary | ICD-10-CM | POA: Diagnosis not present

## 2017-01-19 ENCOUNTER — Other Ambulatory Visit: Payer: Self-pay | Admitting: Neurology

## 2017-02-16 DIAGNOSIS — J101 Influenza due to other identified influenza virus with other respiratory manifestations: Secondary | ICD-10-CM | POA: Diagnosis not present

## 2017-04-03 ENCOUNTER — Other Ambulatory Visit: Payer: Self-pay | Admitting: Neurology

## 2017-04-18 ENCOUNTER — Other Ambulatory Visit: Payer: Self-pay | Admitting: Neurology

## 2017-05-04 DIAGNOSIS — H1032 Unspecified acute conjunctivitis, left eye: Secondary | ICD-10-CM | POA: Diagnosis not present

## 2017-05-27 DIAGNOSIS — E7800 Pure hypercholesterolemia, unspecified: Secondary | ICD-10-CM | POA: Diagnosis not present

## 2017-05-27 DIAGNOSIS — R7301 Impaired fasting glucose: Secondary | ICD-10-CM | POA: Diagnosis not present

## 2017-06-01 ENCOUNTER — Telehealth: Payer: Self-pay | Admitting: Neurology

## 2017-06-01 MED ORDER — GLATIRAMER ACETATE 40 MG/ML ~~LOC~~ SOSY: 40.0000 mg | PREFILLED_SYRINGE | SUBCUTANEOUS | 3 refills | Status: DC

## 2017-06-01 NOTE — Telephone Encounter (Signed)
Copaxone escribed to [facility] as requested/fim

## 2017-06-01 NOTE — Telephone Encounter (Signed)
Pt requesting a refill for COPAXONE 40 MG/ML SOSY sent to [facility]

## 2017-06-07 NOTE — Telephone Encounter (Signed)
Pt called she was advised last night that the rx is written for brand or generic and it cannot have both..so it needs to read: brand only. Also it should also read: dispense as written. Pt said [facility] has been holding the rx, she has been calling for a couple of weeks and finally got a supervisor last night and was finally told of the 2 issues. She also states if a VO can be called in today before 2pm the medication can be shipped out today. Please call [phone]. The pt is also requesting a call when this has been completed. Thank you

## 2017-06-07 NOTE — Telephone Encounter (Signed)
V/O for brand only called to pharmacy as requested/fim

## 2017-06-26 ENCOUNTER — Other Ambulatory Visit: Payer: Self-pay | Admitting: Neurology

## 2017-06-28 ENCOUNTER — Other Ambulatory Visit: Payer: Self-pay

## 2017-06-28 ENCOUNTER — Ambulatory Visit: Payer: BLUE CROSS/BLUE SHIELD | Admitting: Neurology

## 2017-06-28 ENCOUNTER — Encounter: Payer: Self-pay | Admitting: Neurology

## 2017-06-28 VITALS — BP 147/83 | HR 102 | Resp 16 | Ht 69.0 in | Wt 199.0 lb

## 2017-06-28 DIAGNOSIS — G35 Multiple sclerosis: Secondary | ICD-10-CM | POA: Diagnosis not present

## 2017-06-28 DIAGNOSIS — M21371 Foot drop, right foot: Secondary | ICD-10-CM

## 2017-06-28 DIAGNOSIS — M2550 Pain in unspecified joint: Secondary | ICD-10-CM | POA: Diagnosis not present

## 2017-06-28 DIAGNOSIS — R5383 Other fatigue: Secondary | ICD-10-CM

## 2017-06-28 NOTE — Progress Notes (Signed)
[facility]  PATIENT: Lindsay Carter DOB: 10-27-1970  REFERRING DOCTOR OR PCP:  Milagros Evener [facility])   SOURCE: patient and [facility] records                    _________________________________   HISTORICAL  CHIEF COMPLAINT:  Chief Complaint  Patient presents with  . Multiple Sclerosis    Sts. she continues to tolerate Copaxone well.  Sts. gait, right foot drop is about the same/fim    HISTORY OF PRESENT ILLNESS:  Lindsay Carter is a 47 year old with relapsing remitting multiple sclerosis.   Update 06/28/2017:  She feels that her MS has been stable.  She is on Copaxone and she takes 40 mg twice a week.  She tolerates it well and has no exacerbations.  The last MRIs were performed 08/04/2016 and showed no new lesions.  She has a single focus in the cervical spine at C4 to the right.   1 of her main problems is difficulty with gait due to a right foot drop.  This is unchanged.  She also notes some right-sided numbness and tingling.  This is stable.  She has no difficulty with her vision or bladder.  She has some fatigue that fluctuates.    This does not prevent her from doing what she needs to do.  She is sleeping well most nights.  She has no difficulty with mood or cognition.   She works in an office.    She has some joint pain that was worse when she had the flu but she notes that daily ketorolac and as needed Tylenol helps most times it flares up.      Update 12/29/2016:  She is on Copaxone 40 mg twice a week. She tolerates well and has had no exacerbations. She had MRIs in 08/04/2016 of the brain and spinal cord. On the brain she has multiple T2/FLAIR hyperintense foci consistent with MS. There is no change when compared to 2016 MRI. Cervical spine, there is a small focus to the right at C4 in the spinal cord otherwise appears normal. This is a chronic lesion. Images are personally reviewed with Ms. Fales.    She notes no change in gait  or strength.   She has some right sided numbness but no sustained tingling x years.     Vision is fine.   Bladder if fine.   Fatigue is doing well.    She sleeps well most nights.   She denies problems with mood or cognition. She continues to work full-time.  From 06/23/2016:     MS:   She is on Copaxone and tolerates it well.   She has not had any definite exacerbations.  Her last MRI 09/03/2014 showed Chronic demyelinating plaques consistent with MS in the hemispheres with a relatively low plaque burden. There were no enhancing foci.   A thoracic spine from 2009 shows plaque within the spinal cord adjacent to T4T5 and T6-T7.    Gait/strength/sensation: She has right leg spasticity and weakness affecting her gait.  She stumbles some and has occasional falls. She feels her gait is worse later in the day. She has a right foot drop that started with her initial transverse myelitis in 2009.    She is thinking about getting an AFO brace but would like to hold off right now.   Her foot drop is worse with longer distances.    She has no arm weakness.   She notes  tingling in her right hand at times that will occur some days and some night.   She denies any significant numbness in either leg or arm.      Bladder/bowel:  Bladder function is stable with mild urinary frequency and urgency. She does not have any recent incontinence. Three times in the past she has had bowel urgency incontinence events.   Vision: She denies any difficulty with vision. There is no diplopia or eye pain.  Fatigue/sleep: She notes physical > cognitive fatigue.   This is often mild in the morning but may worsen later in the day. .   In the past, she tried modafinil.    Modafinil caused  headache and she stopped. It had helped.   She had some insomnia with early awakening some days but she falls asleep easily.  Mood/cognition. She denies any significant depression or anxiety. She has not had any major cognitive dysfunction but feels  processing speed has slowed some.  She uses lists as reminders.  Joint pain:   She repors the multiple joint pain is doing better.    Rheum labs were negative in 2017.    She is on Lodine  She filled the tramadol script but has not taken any.    MS History:   Prior to 2010, she presented with several episodes of numbness and MRI of the spine was reportedly normal (however on my recent review there appear to be 2 thoracic plaques at T4T5 and T6T7).   In 2010, she had an episode of limping and was referred to me.   MRI of the spine showed one focus and MRI of the brain showed multiple foci consistent with t multiple sclerosis.  There was one enhancing plaque in the left posterior frontal lobe.   She was started on Copaxone and noted mild lipoatrophy. Of note, she is HLA DR 1501 positive. She was clinically stable, she initially went to Copaxone 20 mg every other day in 2012 and then switched to 40 mg 3 times a week in 2014. In 2015 she switch to 40 mg twice a week.     REVIEW OF SYSTEMS: Constitutional: No fevers, chills, sweats, or change in appetite.   Has fatigue Eyes: No visual changes, double vision, eye pain Ear, nose and throat: No hearing loss, ear pain, nasal congestion, sore throat Cardiovascular: No chest pain, palpitations Respiratory: No shortness of breath at rest or with exertion.   No wheezes GastrointestinaI: No nausea, vomiting, diarrhea, abdominal pain, fecal incontinence Genitourinary: No dysuria, urinary retention.  Minimal frequency and minimal stress incontinence.  No nocturia. Musculoskeletal: No neck pain.  Mild Lower back pain Integumentary: No rash, pruritus, skin lesions Neurological: as above Psychiatric: No depression at this time.  No anxiety Endocrine: No palpitations, diaphoresis, change in appetite, change in weigh or increased thirst Hematologic/Lymphatic: No anemia, purpura, petechiae. Allergic/Immunologic: No itchy/runny eyes, nasal congestion, recent  allergic reactions, rashes  ALLERGIES: Allergies  Allergen Reactions  . Erythromycin Nausea And Vomiting    HOME MEDICATIONS:  Current Outpatient Medications:  .  acetaminophen (TYLENOL) 500 MG tablet, Take 1,000 mg by mouth every 6 (six) hours as needed., Disp: , Rfl:  .  cetirizine (ZYRTEC) 10 MG tablet, Take 10 mg by mouth daily., Disp: , Rfl: 4 .  cholecalciferol (VITAMIN D) 1000 UNITS tablet, Take 2,000 Units by mouth daily., Disp: , Rfl:  .  Cranberry 125 MG TABS, Take 125 mg by mouth daily., Disp: , Rfl:  .  etodolac (LODINE) 400  MG tablet, TAKE 1 TABLET BY MOUTH TWICE A DAY, Disp: 180 tablet, Rfl: 0 .  fluticasone (FLONASE) 50 MCG/ACT nasal spray, , Disp: , Rfl: 2 .  Glatiramer Acetate (COPAXONE) 40 MG/ML SOSY, Inject 40 mg into the skin 2 (two) times a week., Disp: 24 Syringe, Rfl: 3 .  hyoscyamine (ANASPAZ) 0.125 MG TBDP disintergrating tablet, Place 0.125 mg under the tongue., Disp: , Rfl:  .  JUNEL 1.5/30 1.5-30 MG-MCG tablet, Take 1 tablet by mouth daily., Disp: , Rfl: 3 .  loratadine-pseudoephedrine (CLARITIN-D 12-HOUR) 5-120 MG tablet, Take 1 tablet by mouth at bedtime as needed for allergies., Disp: , Rfl:  .  Melatonin 3 MG CAPS, Take 3 mg by mouth at bedtime., Disp: , Rfl:  .  Minoxidil (ROGAINE WOMENS) 5 % FOAM, Apply topically., Disp: , Rfl:  .  Multiple Vitamin (MULTIVITAMIN) tablet, Take 1 tablet by mouth daily., Disp: , Rfl:  .  polyethylene glycol (MIRALAX / GLYCOLAX) packet, Take 17 g by mouth daily., Disp: , Rfl:  .  Rutin 50 MG TABS, Take by mouth., Disp: , Rfl:  .  simvastatin (ZOCOR) 10 MG tablet, Take 10 mg by mouth every evening., Disp: , Rfl: 1 .  vitamin B-12 (CYANOCOBALAMIN) 500 MCG tablet, Take 500 mcg by mouth 2 (two) times daily. Reported on 04/24/2015, Disp: , Rfl:  .  vitamin C (ASCORBIC ACID) 500 MG tablet, Take 1,000 mg by mouth daily. Reported on 04/24/2015, Disp: , Rfl:  .  traMADol (ULTRAM) 50 MG tablet, Take 1 tablet (50 mg total) by mouth 3  (three) times daily as needed. (Patient not taking: Reported on 06/28/2017), Disp: 90 tablet, Rfl: 0  PAST MEDICAL HISTORY: Past Medical History:  Diagnosis Date  . Headache   . Multiple sclerosis ([place])   . Vision abnormalities     PAST SURGICAL HISTORY: Past Surgical History:  Procedure Laterality Date  . KNEE ARTHROSCOPY Left    2006    FAMILY HISTORY: Family History  Problem Relation Age of Onset  . Hypertension Mother   . Ataxia Father   . CAD Father   . Parkinson's disease Father   . Bladder Cancer Father     SOCIAL HISTORY:  Social History   Socioeconomic History  . Marital status: Married    Spouse name: Not on file  . Number of children: Not on file  . Years of education: Not on file  . Highest education level: Not on file  Occupational History  . Occupation: Glass blower/designer  Social Needs  . Financial resource strain: Not on file  . Food insecurity:    Worry: Not on file    Inability: Not on file  . Transportation needs:    Medical: Not on file    Non-medical: Not on file  Tobacco Use  . Smoking status: Never Smoker  . Smokeless tobacco: Never Used  Substance and Sexual Activity  . Alcohol use: Yes    Alcohol/week: 0.0 oz    Comment: 2-3 beers per week/fim  . Drug use: No  . Sexual activity: Not on file  Lifestyle  . Physical activity:    Days per week: Not on file    Minutes per session: Not on file  . Stress: Not on file  Relationships  . Social connections:    Talks on phone: Not on file    Gets together: Not on file    Attends religious service: Not on file    Active member of club or organization: Not  on file    Attends meetings of clubs or organizations: Not on file    Relationship status: Not on file  . Intimate partner violence:    Fear of current or ex partner: Not on file    Emotionally abused: Not on file    Physically abused: Not on file    Forced sexual activity: Not on file  Other Topics Concern  . Not on file  Social  History Narrative  . Not on file     PHYSICAL EXAM  Vitals:   06/28/17 0820  BP: (!) 147/83  Pulse: (!) 102  Resp: 16  Weight: 199 lb (90.3 kg)  Height: _0  (1.753 m)    Body mass index is 29.39 kg/m.   General: The patient is well-developed and well-nourished and in no acute distress  Musculoskeletal:     Right knee has normal range of motion but is tender to deep palpation.    Neurologic Exam  Mental status: The patient is alert and oriented x 3 at the time of the examination. The patient has apparent normal recent and remote memory, with an apparently normal attention span and concentration ability.   Speech is normal.  Cranial nerves: Extraocular movements are full.   Facial strength and sensation is normal.  Trapezius strength is normal.  No dysarthria is noted.  No obvious hearing deficits are noted.                                                                                                                                                                                         Motor:  Muscle bulk is normal.  She has increased muscle tone in the legs, right greater than left.   Strength is 5/5 in the arms and the left leg. Strength is 4+/5 at the right ankle and toes.   Sensory:  There was intact sensation to touch and vibration in the arms and legs.      Coordination: Cerebellar testing shows good finger-nose-finger and left heel-to-shin but reduced right heel-to-shin  Gait and station: Station is normal.   S she has a right foot drop.  The gait is mildly wide.  Tandem gait is moderately wide and she dragged the right foot more.  Romberg is negative.  Reflexes: Deep tendon reflexes are increased on both legs with spread at the knees and non-sustained clonus at right ankle.      Other:   There are no Tinel's or Phalen signs at the wrists.    DIAGNOSTIC DATA (LABS, IMAGING, TESTING) - I reviewed patient records, labs, notes, testing and imaging myself where  available.  ASSESSMENT AND PLAN  Multiple sclerosis ([facility])  Right foot drop  Other fatigue  Multiple joint pain   1.   We will continue Copaxone 40 mg twice weekly.  Consider checking an MRI later this year or early next year to make sure that she is not having any subclinical progression.  If this is occurring we would need to consider a change to a different medication. 2.   Continue the etodolac for joint pain. 3.  Stay active and exercise as tolerated.   4.   She will return to see me in 6 months or sooner if she has new or worsening neurologic symptoms.    Richard A. Felecia Shelling, MD, PhD 9/96/7227, 7:37 AM Certified in Neurology, Clinical Neurophysiology, Sleep Medicine, Pain Medicine and Neuroimaging  [facility] [address] [place], [place] [address] [phone]

## 2017-07-08 DIAGNOSIS — Z23 Encounter for immunization: Secondary | ICD-10-CM | POA: Diagnosis not present

## 2017-08-25 DIAGNOSIS — D2272 Melanocytic nevi of left lower limb, including hip: Secondary | ICD-10-CM | POA: Diagnosis not present

## 2017-08-25 DIAGNOSIS — D2262 Melanocytic nevi of left upper limb, including shoulder: Secondary | ICD-10-CM | POA: Diagnosis not present

## 2017-08-25 DIAGNOSIS — D2261 Melanocytic nevi of right upper limb, including shoulder: Secondary | ICD-10-CM | POA: Diagnosis not present

## 2017-08-25 DIAGNOSIS — D225 Melanocytic nevi of trunk: Secondary | ICD-10-CM | POA: Diagnosis not present

## 2017-09-20 ENCOUNTER — Encounter: Payer: Self-pay | Admitting: *Deleted

## 2017-09-20 ENCOUNTER — Telehealth: Payer: Self-pay | Admitting: Neurology

## 2017-09-20 NOTE — Telephone Encounter (Signed)
Spoke with Lindsay Carter.  She received a jury summons .She believes that, due to her chronic physical and mental fatigue and bladder disturnbance related to MS, she is not able to serve on a jury. She requires frequent breaks for rest and frequent bathroom breaks, which she would not likely be able to take if she were on a jury.  Letter to excuse her written and is up front [employer]. She will pick it up in the office tomorrow/fim

## 2017-09-20 NOTE — Telephone Encounter (Signed)
Pt requesting a call, stating she will be needing a letter from Dr. Epimenio Foot. Did not wish to discuss further with me.

## 2017-09-21 ENCOUNTER — Other Ambulatory Visit: Payer: Self-pay | Admitting: Neurology

## 2017-12-17 ENCOUNTER — Other Ambulatory Visit: Payer: Self-pay | Admitting: Neurology

## 2017-12-19 ENCOUNTER — Other Ambulatory Visit (HOSPITAL_COMMUNITY)
Admission: RE | Admit: 2017-12-19 | Discharge: 2017-12-19 | Disposition: A | Discharge status: Home / Self Care | Payer: BLUE CROSS/BLUE SHIELD | Source: Ambulatory Visit | Attending: Family Medicine | Admitting: Family Medicine

## 2017-12-19 ENCOUNTER — Other Ambulatory Visit: Payer: Self-pay | Admitting: Family Medicine

## 2017-12-19 DIAGNOSIS — Z0001 Encounter for general adult medical examination with abnormal findings: Secondary | ICD-10-CM | POA: Diagnosis not present

## 2017-12-19 DIAGNOSIS — Z124 Encounter for screening for malignant neoplasm of cervix: Secondary | ICD-10-CM | POA: Diagnosis not present

## 2017-12-19 DIAGNOSIS — G35 Multiple sclerosis: Secondary | ICD-10-CM | POA: Diagnosis not present

## 2017-12-19 DIAGNOSIS — Z309 Encounter for contraceptive management, unspecified: Secondary | ICD-10-CM | POA: Diagnosis not present

## 2017-12-19 DIAGNOSIS — E7800 Pure hypercholesterolemia, unspecified: Secondary | ICD-10-CM | POA: Diagnosis not present

## 2017-12-20 LAB — CYTOLOGY - PAP
Diagnosis: NEGATIVE
HPV: NOT DETECTED

## 2017-12-28 ENCOUNTER — Ambulatory Visit: Payer: BLUE CROSS/BLUE SHIELD | Admitting: Neurology

## 2017-12-29 ENCOUNTER — Ambulatory Visit: Payer: BLUE CROSS/BLUE SHIELD | Admitting: Neurology

## 2017-12-30 DIAGNOSIS — J3089 Other allergic rhinitis: Secondary | ICD-10-CM | POA: Diagnosis not present

## 2017-12-30 DIAGNOSIS — R062 Wheezing: Secondary | ICD-10-CM | POA: Diagnosis not present

## 2018-01-04 ENCOUNTER — Encounter: Payer: Self-pay | Admitting: Neurology

## 2018-01-04 ENCOUNTER — Ambulatory Visit: Payer: BLUE CROSS/BLUE SHIELD | Admitting: Neurology

## 2018-01-04 ENCOUNTER — Telehealth: Payer: Self-pay | Admitting: Neurology

## 2018-01-04 VITALS — BP 142/88 | HR 113 | Wt 199.5 lb

## 2018-01-04 DIAGNOSIS — G373 Acute transverse myelitis in demyelinating disease of central nervous system: Secondary | ICD-10-CM | POA: Diagnosis not present

## 2018-01-04 DIAGNOSIS — R5383 Other fatigue: Secondary | ICD-10-CM

## 2018-01-04 DIAGNOSIS — G35 Multiple sclerosis: Secondary | ICD-10-CM | POA: Diagnosis not present

## 2018-01-04 DIAGNOSIS — M21371 Foot drop, right foot: Secondary | ICD-10-CM

## 2018-01-04 NOTE — Progress Notes (Signed)
[facility]  PATIENT: Lindsay Carter DOB: Jun 16, 1970  REFERRING DOCTOR OR PCP:  Milagros Evener [facility])   SOURCE: patient and [facility] records                    _________________________________   HISTORICAL  CHIEF COMPLAINT:  Chief Complaint  Patient presents with  . Follow-up    RM 13, alone. Last seen 06/28/17  . Multiple Sclerosis    On Copaxone. Doing well on medication, no SE.   . Numbness     Noticed numbness in tips of fingers on right hand in last couple weeks.     HISTORY OF PRESENT ILLNESS:  Lindsay Carter is a 47 y.o. woman with relapsing remitting multiple sclerosis.   Update 01/04/18: She is on Copaxone 40 mg tiw.    She is tolerating it well if she takes a Benadryl before each shot.   Her gait is stable.  She can walk about 200 yards without stopping.   Some stumbles but no falls.    She uses the rail on stairs.   Her right side is weak and left is fine.   Occasionally she has numbness in her right hand that is random.     Bladder function is doing well.    Vision is fine.   She feels fatigue is tolerable most days but it prevents her form doing errands other days.   Mood is ok.   Cognition is ok unless she is tired.   She is sleeping well with melatonin.   I showed her images from her last MRI.  Most of her disability is likely coming from a right lateral focus in the spinal cord around C4.  Update 06/28/2017:  She feels that her MS has been stable.  She is on Copaxone and she takes 40 mg twice a week.  She tolerates it well and has no exacerbations.  The last MRIs were performed 08/04/2016 and showed no new lesions.  She has a single focus in the cervical spine at C4 to the right.   1 of her main problems is difficulty with gait due to a right foot drop.  This is unchanged.  She also notes some right-sided numbness and tingling.  This is stable.  She has no difficulty with her vision or bladder.  She has some fatigue that  fluctuates.    This does not prevent her from doing what she needs to do.  She is sleeping well most nights.  She has no difficulty with mood or cognition.   She works in an office.    She has some joint pain that was worse when she had the flu but she notes that daily ketorolac and as needed Tylenol helps most times it flares up.      Update 12/29/2016:  She is on Copaxone 40 mg twice a week. She tolerates well and has had no exacerbations. She had MRIs in 08/04/2016 of the brain and spinal cord. On the brain she has multiple T2/FLAIR hyperintense foci consistent with MS. There is no change when compared to 2016 MRI. Cervical spine, there is a small focus to the right at C4 in the spinal cord otherwise appears normal. This is a chronic lesion. Images are personally reviewed with Lindsay Carter.    She notes no change in gait or strength.   She has some right sided numbness but no sustained tingling x years.     Vision is fine.  Bladder if fine.   Fatigue is doing well.    She sleeps well most nights.   She denies problems with mood or cognition. She continues to work full-time.  From 06/23/2016:     MS:   She is on Copaxone and tolerates it well.   She has not had any definite exacerbations.  Her last MRI 09/03/2014 showed Chronic demyelinating plaques consistent with MS in the hemispheres with a relatively low plaque burden. There were no enhancing foci.   A thoracic spine from 2009 shows plaque within the spinal cord adjacent to T4T5 and T6-T7.    Gait/strength/sensation: She has right leg spasticity and weakness affecting her gait.  She stumbles some and has occasional falls. She feels her gait is worse later in the day. She has a right foot drop that started with her initial transverse myelitis in 2009.    She is thinking about getting an AFO brace but would like to hold off right now.   Her foot drop is worse with longer distances.    She has no arm weakness.   She notes tingling in her right hand at  times that will occur some days and some night.   She denies any significant numbness in either leg or arm.      Bladder/bowel:  Bladder function is stable with mild urinary frequency and urgency. She does not have any recent incontinence. Three times in the past she has had bowel urgency incontinence events.   Vision: She denies any difficulty with vision. There is no diplopia or eye pain.  Fatigue/sleep: She notes physical > cognitive fatigue.   This is often mild in the morning but may worsen later in the day. .   In the past, she tried modafinil.    Modafinil caused  headache and she stopped. It had helped.   She had some insomnia with early awakening some days but she falls asleep easily.  Mood/cognition. She denies any significant depression or anxiety. She has not had any major cognitive dysfunction but feels processing speed has slowed some.  She uses lists as reminders.  Joint pain:   She repors the multiple joint pain is doing better.    Rheum labs were negative in 2017.    She is on Lodine  She filled the tramadol script but has not taken any.    MS History:   Prior to 2010, she presented with several episodes of numbness and MRI of the spine was reportedly normal (however on my recent review there appear to be 2 thoracic plaques at T4T5 and T6T7).   In 2010, she had an episode of limping and was referred to me.   MRI of the spine showed one focus and MRI of the brain showed multiple foci consistent with t multiple sclerosis.  There was one enhancing plaque in the left posterior frontal lobe.   She was started on Copaxone and noted mild lipoatrophy. Of note, she is HLA DR 1501 positive. She was clinically stable, she initially went to Copaxone 20 mg every other day in 2012 and then switched to 40 mg 3 times a week in 2014. In 2015 she switch to 40 mg twice a week.     REVIEW OF SYSTEMS: Constitutional: No fevers, chills, sweats, or change in appetite.   Has fatigue Eyes: No visual  changes, double vision, eye pain Ear, nose and throat: No hearing loss, ear pain, nasal congestion, sore throat Cardiovascular: No chest pain, palpitations Respiratory: No shortness  of breath at rest or with exertion.   No wheezes GastrointestinaI: No nausea, vomiting, diarrhea, abdominal pain, fecal incontinence Genitourinary: No dysuria, urinary retention.  Minimal frequency and minimal stress incontinence.  No nocturia. Musculoskeletal: No neck pain.  Mild Lower back pain Integumentary: No rash, pruritus, skin lesions Neurological: as above Psychiatric: No depression at this time.  No anxiety Endocrine: No palpitations, diaphoresis, change in appetite, change in weigh or increased thirst Hematologic/Lymphatic: No anemia, purpura, petechiae. Allergic/Immunologic: No itchy/runny eyes, nasal congestion, recent allergic reactions, rashes  ALLERGIES: Allergies  Allergen Reactions  . Erythromycin Nausea And Vomiting    HOME MEDICATIONS:  Current Outpatient Medications:  .  acetaminophen (TYLENOL) 500 MG tablet, Take 1,000 mg by mouth every 6 (six) hours as needed., Disp: , Rfl:  .  cetirizine (ZYRTEC) 10 MG tablet, Take 10 mg by mouth daily., Disp: , Rfl: 4 .  cholecalciferol (VITAMIN D) 1000 UNITS tablet, Take 2,000 Units by mouth daily., Disp: , Rfl:  .  Cranberry 125 MG TABS, Take 125 mg by mouth daily., Disp: , Rfl:  .  etodolac (LODINE) 400 MG tablet, TAKE 1 TABLET BY MOUTH TWICE A DAY, Disp: 180 tablet, Rfl: 0 .  fluticasone (FLONASE) 50 MCG/ACT nasal spray, , Disp: , Rfl: 2 .  Glatiramer Acetate (COPAXONE) 40 MG/ML SOSY, Inject 40 mg into the skin 2 (two) times a week., Disp: 24 Syringe, Rfl: 3 .  hyoscyamine (ANASPAZ) 0.125 MG TBDP disintergrating tablet, Place 0.125 mg under the tongue., Disp: , Rfl:  .  JUNEL 1/20 1-20 MG-MCG tablet, Take 1 tablet by mouth daily., Disp: , Rfl: 11 .  loratadine-pseudoephedrine (CLARITIN-D 12-HOUR) 5-120 MG tablet, Take 1 tablet by mouth at  bedtime as needed for allergies., Disp: , Rfl:  .  Melatonin 3 MG CAPS, Take 3 mg by mouth at bedtime., Disp: , Rfl:  .  Minoxidil (ROGAINE WOMENS) 5 % FOAM, Apply topically., Disp: , Rfl:  .  Multiple Vitamin (MULTIVITAMIN) tablet, Take 1 tablet by mouth daily., Disp: , Rfl:  .  polyethylene glycol (MIRALAX / GLYCOLAX) packet, Take 17 g by mouth daily., Disp: , Rfl:  .  Rutin 50 MG TABS, Take by mouth., Disp: , Rfl:  .  simvastatin (ZOCOR) 10 MG tablet, Take 10 mg by mouth every evening., Disp: , Rfl: 1 .  vitamin B-12 (CYANOCOBALAMIN) 500 MCG tablet, Take 500 mcg by mouth 2 (two) times daily. Reported on 04/24/2015, Disp: , Rfl:  .  vitamin C (ASCORBIC ACID) 500 MG tablet, Take 1,000 mg by mouth daily. Reported on 04/24/2015, Disp: , Rfl:   PAST MEDICAL HISTORY: Past Medical History:  Diagnosis Date  . Headache   . Multiple sclerosis ([place])   . Vision abnormalities     PAST SURGICAL HISTORY: Past Surgical History:  Procedure Laterality Date  . KNEE ARTHROSCOPY Left    2006    FAMILY HISTORY: Family History  Problem Relation Age of Onset  . Hypertension Mother   . Ataxia Father   . CAD Father   . Parkinson's disease Father   . Bladder Cancer Father     SOCIAL HISTORY:  Social History   Socioeconomic History  . Marital status: Married    Spouse name: Not on file  . Number of children: Not on file  . Years of education: Not on file  . Highest education level: Not on file  Occupational History  . Occupation: Glass blower/designer  Social Needs  . Financial resource strain: Not on file  .  Food insecurity:    Worry: Not on file    Inability: Not on file  . Transportation needs:    Medical: Not on file    Non-medical: Not on file  Tobacco Use  . Smoking status: Never Smoker  . Smokeless tobacco: Never Used  Substance and Sexual Activity  . Alcohol use: Yes    Alcohol/week: 0.0 standard drinks    Comment: 2-3 beers per week/fim  . Drug use: No  . Sexual activity: Not on  file  Lifestyle  . Physical activity:    Days per week: Not on file    Minutes per session: Not on file  . Stress: Not on file  Relationships  . Social connections:    Talks on phone: Not on file    Gets together: Not on file    Attends religious service: Not on file    Active member of club or organization: Not on file    Attends meetings of clubs or organizations: Not on file    Relationship status: Not on file  . Intimate partner violence:    Fear of current or ex partner: Not on file    Emotionally abused: Not on file    Physically abused: Not on file    Forced sexual activity: Not on file  Other Topics Concern  . Not on file  Social History Narrative   Right handed    Caffeine use: 1/2 cup coffee per day     PHYSICAL EXAM  Vitals:   01/04/18 0848  BP: (!) 142/88  Pulse: (!) 113  SpO2: 98%  Weight: 199 lb 8 oz (90.5 kg)    Body mass index is 29.46 kg/m.   General: The patient is well-developed and well-nourished and in no acute distress    Neurologic Exam  Mental status: The patient is alert and oriented x 3 at the time of the examination. The patient has apparent normal recent and remote memory, with an apparently normal attention span and concentration ability.   Speech is normal.  Cranial nerves: Extraocular movements are full.   Facial strength and sensation is normal.  Trapezius strength is normal.  No dysarthria is noted.  No obvious hearing deficits are noted.                                                                                                                                                                                        Motor:  Muscle bulk is normal.  She has increased muscle tone in the legs, right greater than left.   Strength is 5/5 in the arms and the left leg. Strength is 4+/5 at the  right ankle and toes.   Sensory:  There was intact sensation to touch and vibration in the arms and legs.      Coordination: She has good  finger-nose-finger bilaterally.  Heel-to-shin is performed well on the left but reduced on the right.  Gait and station: Station is normal.   There is a right foot drop..  The gait is mildly wide.  Tandem gait is moderately wide and she dragged the right foot more.  Romberg is negative.  Reflexes: Deep tendon reflexes are increased on both legs with spread at the knees and non-sustained clonus at the right ankle.         DIAGNOSTIC DATA (LABS, IMAGING, TESTING) - I reviewed patient records, labs, notes, testing and imaging myself where available.     ASSESSMENT AND PLAN  Multiple sclerosis ([place]) - Plan: MR BRAIN W WO CONTRAST   1.   Continue Copaxone 40 mg 2-3 times a week..  Check MRI of the brain to determine if there is any subclinical progression and consider a different disease modifying therapy if this is occurring.   2.   Stay active and exercise as tolerated.   3.   She will return to see me in 6 months or sooner if she has new or worsening neurologic symptoms.    Vibhav Waddill A. Felecia Shelling, MD, PhD 32/03/3341, 56:86 PM Certified in Neurology, Clinical Neurophysiology, Sleep Medicine, Pain Medicine and Neuroimaging  [facility] [address], [place], [place] [address] [phone]

## 2018-01-04 NOTE — Telephone Encounter (Signed)
Patient is aware if she has not heard in the next 2-3 business days to give them a call.  °

## 2018-01-04 NOTE — Telephone Encounter (Signed)
[employer] please scheduled in January order sent to GI. They will reach out to the pt to schedule.

## 2018-03-09 ENCOUNTER — Other Ambulatory Visit: Payer: Self-pay | Admitting: Neurology

## 2018-05-03 ENCOUNTER — Telehealth: Payer: Self-pay | Admitting: Neurology

## 2018-05-03 NOTE — Telephone Encounter (Signed)
Spoke with Dr. Isa Rankin wants to know what she needs the letter for. She does not have a compromised immune system being on copaxone. He wanted to clarify that copaxone does not affect the immune system much. We could state she has MS but not that she has compromised immune system

## 2018-05-03 NOTE — Telephone Encounter (Signed)
Pt is needing letter stating she has immune issues without stating the cause. She has been told she does not have to tell her employer she has MS. Please call to advise. She is wanting it sent to her email address: [email or web address]

## 2018-05-04 NOTE — Telephone Encounter (Signed)
I called pt back and relayed below message. She verbalized understanding. 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 They are having some people work from home and some in the office. She will call back if any further is needed in the future.

## 2018-05-05 ENCOUNTER — Other Ambulatory Visit: Payer: Self-pay | Admitting: Neurology

## 2018-06-01 ENCOUNTER — Other Ambulatory Visit: Payer: Self-pay | Admitting: Neurology

## 2018-07-05 ENCOUNTER — Ambulatory Visit: Payer: BLUE CROSS/BLUE SHIELD | Admitting: Neurology

## 2018-08-03 ENCOUNTER — Telehealth: Payer: Self-pay | Admitting: *Deleted

## 2018-08-03 NOTE — Telephone Encounter (Signed)
Called pt. She consented to virtual visit with Dr. Felecia Shelling. Not signed up for mychart, sent email for [email or web address] visit to [email or web address] Confirmed she received while on the phone. Updated med list, pharmacy, allergies on file.

## 2018-08-15 ENCOUNTER — Ambulatory Visit (INDEPENDENT_AMBULATORY_CARE_PROVIDER_SITE_OTHER): Payer: BC Managed Care – PPO | Admitting: Neurology

## 2018-08-15 ENCOUNTER — Encounter: Payer: Self-pay | Admitting: Neurology

## 2018-08-15 ENCOUNTER — Other Ambulatory Visit: Payer: Self-pay

## 2018-08-15 DIAGNOSIS — R5383 Other fatigue: Secondary | ICD-10-CM

## 2018-08-15 DIAGNOSIS — M21371 Foot drop, right foot: Secondary | ICD-10-CM

## 2018-08-15 DIAGNOSIS — G35 Multiple sclerosis: Secondary | ICD-10-CM | POA: Diagnosis not present

## 2018-08-15 DIAGNOSIS — G4700 Insomnia, unspecified: Secondary | ICD-10-CM

## 2018-08-15 NOTE — Progress Notes (Signed)
[facility]  PATIENT: Lindsay Carter DOB: December 21, 1970  REFERRING DOCTOR OR PCP:  Milagros Evener [facility])   SOURCE: patient and [facility] records                    _________________________________   HISTORICAL  CHIEF COMPLAINT:  Chief Complaint  Patient presents with  . Multiple Sclerosis    On Copaxone    HISTORY OF PRESENT ILLNESS:  Lindsay Carter is a 48 y.o. woman with relapsing remitting multiple sclerosis.   Update 08/15/2018: Virtual Visit via Video Note I connected with Lindsay Carter  on 08/15/18 at  4:00 PM EDT by telephone (unable to connect with 2 different video enabled telemedicine applications) and verified that I am speaking with the correct person.  I discussed the limitations of evaluation and management by telemedicine and the availability of in person appointments. The patient expressed understanding and agreed to proceed.  History of Present Illness: She is on Copaxone 40 mg tiw.    She is tolerating it well if she takes a Benadryl before each shot.   Due to right leg weakness, her gait is off and unbalanced.  She can walk about 150 to 200 yards without stopping.   Some stumbles but no falls.    She uses the rail on stairs.   Her right side is weak and some spasticity and left is fine.   Occasionally she has numbness in her right leg that is always present.     Bladder function is doing well.    Vision is fine.     She has mild fatigue but feels it is better sinceeels fatigue is tolerable most days but it prevents her form doing errands other days.   Mood is ok.   Cognition is ok unless she is tired.   Insomnia improved with melatonin.   She is working from her home now due to Covid-19   Observations/Objective:   She is a well-developed well-nourished woman in no acute distress.  The head is normocephalic and atraumatic.  Sclera are anicteric.  Visible skin appears normal.  The neck has a good range of motion.  Pharynx  and tongue have normal appearance.  She is alert and fully oriented with fluent speech and good attention, knowledge and memory.  Extraocular muscles are intact.  Facial strength is normal.  Palatal elevation and tongue protrusion are midline.  She appears to have normal strength in the arms.  Rapid alternating movements and finger-nose-finger are performed well.  Assessment and Plan: 1. Multiple sclerosis ([place])   2. Right foot drop   3. Other fatigue   4. Insomnia, unspecified type    1.   Continue Copaxone.  We need to check an MRI of the brain to determine if there has been any subclinical progression and consider a stronger disease modifying therapy if this is been occurring. 2.   She will continue to follow CDC guidelines to reduce her chance of covid-19 3.  Stay active and exercise as tolerated. 4.   Return to see me in 6 months or sooner if there are new or worsening neurologic symptoms.  Follow Up Instructions: I discussed the assessment and treatment plan with the patient. The patient was provided an opportunity to ask questions and all were answered. The patient agreed with the plan and demonstrated an understanding of the instructions.    The patient was advised to call back or seek an in-person evaluation if the symptoms  worsen or if the condition fails to improve as anticipated.  I provided 22 minutes of non-face-to-face time during this encounter.  _________________________________ From previous visits Update 01/04/18: She is on Copaxone 40 mg tiw.    She is tolerating it well if she takes a Benadryl before each shot.   Her gait is stable.  She can walk about 200 yards without stopping.   Some stumbles but no falls.    She uses the rail on stairs.   Her right side is weak and left is fine.   Occasionally she has numbness in her right hand that is random.     Bladder function is doing well.    Vision is fine.   She feels fatigue is tolerable most days but it prevents her form doing  errands other days.   Mood is ok.   Cognition is ok unless she is tired.   She is sleeping well with melatonin.   I showed her images from her last MRI.  Most of her disability is likely coming from a right lateral focus in the spinal cord around C4.  Update 06/28/2017:  She feels that her MS has been stable.  She is on Copaxone and she takes 40 mg twice a week.  She tolerates it well and has no exacerbations.  The last MRIs were performed 08/04/2016 and showed no new lesions.  She has a single focus in the cervical spine at C4 to the right.   1 of her main problems is difficulty with gait due to a right foot drop.  This is unchanged.  She also notes some right-sided numbness and tingling.  This is stable.  She has no difficulty with her vision or bladder.  She has some fatigue that fluctuates.    This does not prevent her from doing what she needs to do.  She is sleeping well most nights.  She has no difficulty with mood or cognition.   She works in an office.    She has some joint pain that was worse when she had the flu but she notes that daily ketorolac and as needed Tylenol helps most times it flares up.      Update 12/29/2016:  She is on Copaxone 40 mg twice a week. She tolerates well and has had no exacerbations. She had MRIs in 08/04/2016 of the brain and spinal cord. On the brain she has multiple T2/FLAIR hyperintense foci consistent with MS. There is no change when compared to 2016 MRI. Cervical spine, there is a small focus to the right at C4 in the spinal cord otherwise appears normal. This is a chronic lesion. Images are personally reviewed with Ms. Schomburg.    She notes no change in gait or strength.   She has some right sided numbness but no sustained tingling x years.     Vision is fine.   Bladder if fine.   Fatigue is doing well.    She sleeps well most nights.   She denies problems with mood or cognition. She continues to work full-time.  From 06/23/2016:     MS:   She is on Copaxone  and tolerates it well.   She has not had any definite exacerbations.  Her last MRI 09/03/2014 showed Chronic demyelinating plaques consistent with MS in the hemispheres with a relatively low plaque burden. There were no enhancing foci.   A thoracic spine from 2009 shows plaque within the spinal cord adjacent to T4T5 and T6-T7.  Gait/strength/sensation: She has right leg spasticity and weakness affecting her gait.  She stumbles some and has occasional falls. She feels her gait is worse later in the day. She has a right foot drop that started with her initial transverse myelitis in 2009.    She is thinking about getting an AFO brace but would like to hold off right now.   Her foot drop is worse with longer distances.    She has no arm weakness.   She notes tingling in her right hand at times that will occur some days and some night.   She denies any significant numbness in either leg or arm.      Bladder/bowel:  Bladder function is stable with mild urinary frequency and urgency. She does not have any recent incontinence. Three times in the past she has had bowel urgency incontinence events.   Vision: She denies any difficulty with vision. There is no diplopia or eye pain.  Fatigue/sleep: She notes physical > cognitive fatigue.   This is often mild in the morning but may worsen later in the day. .   In the past, she tried modafinil.    Modafinil caused  headache and she stopped. It had helped.   She had some insomnia with early awakening some days but she falls asleep easily.  Mood/cognition. She denies any significant depression or anxiety. She has not had any major cognitive dysfunction but feels processing speed has slowed some.  She uses lists as reminders.  Joint pain:   She repors the multiple joint pain is doing better.    Rheum labs were negative in 2017.    She is on Lodine  She filled the tramadol script but has not taken any.    MS History:   Prior to 2010, she presented with several episodes  of numbness and MRI of the spine was reportedly normal (however on my recent review there appear to be 2 thoracic plaques at T4T5 and T6T7).   In 2010, she had an episode of limping and was referred to me.   MRI of the spine showed one focus and MRI of the brain showed multiple foci consistent with t multiple sclerosis.  There was one enhancing plaque in the left posterior frontal lobe.   She was started on Copaxone and noted mild lipoatrophy. Of note, she is HLA DR 1501 positive. She was clinically stable, she initially went to Copaxone 20 mg every other day in 2012 and then switched to 40 mg 3 times a week in 2014. In 2015 she switch to 40 mg twice a week.     REVIEW OF SYSTEMS: Constitutional: No fevers, chills, sweats, or change in appetite.   Has fatigue Eyes: No visual changes, double vision, eye pain Ear, nose and throat: No hearing loss, ear pain, nasal congestion, sore throat Cardiovascular: No chest pain, palpitations Respiratory: No shortness of breath at rest or with exertion.   No wheezes GastrointestinaI: No nausea, vomiting, diarrhea, abdominal pain, fecal incontinence Genitourinary: No dysuria, urinary retention.  Minimal frequency and minimal stress incontinence.  No nocturia. Musculoskeletal: No neck pain.  Mild Lower back pain Integumentary: No rash, pruritus, skin lesions Neurological: as above Psychiatric: No depression at this time.  No anxiety Endocrine: No palpitations, diaphoresis, change in appetite, change in weigh or increased thirst Hematologic/Lymphatic: No anemia, purpura, petechiae. Allergic/Immunologic: No itchy/runny eyes, nasal congestion, recent allergic reactions, rashes  ALLERGIES: Allergies  Allergen Reactions  . Erythromycin Nausea And Vomiting    HOME  MEDICATIONS:  Current Outpatient Medications:  .  acetaminophen (TYLENOL) 500 MG tablet, Take 1,000 mg by mouth every 6 (six) hours as needed., Disp: , Rfl:  .  cetirizine (ZYRTEC) 10 MG tablet,  Take 10 mg by mouth daily., Disp: , Rfl: 4 .  cholecalciferol (VITAMIN D) 1000 UNITS tablet, Take 2,000 Units by mouth daily., Disp: , Rfl:  .  COPAXONE 40 MG/ML SOSY, Inject 25m under the skin 2-3 times a week (Patient taking differently: Inject into the skin 2 (two) times a week. Inject 427munder the skin 2-3 times a week), Disp: 36 Syringe, Rfl: 3 .  Cranberry 125 MG TABS, Take 125 mg by mouth daily., Disp: , Rfl:  .  etodolac (LODINE) 400 MG tablet, TAKE 1 TABLET BY MOUTH TWICE A DAY, Disp: 180 tablet, Rfl: 0 .  fluticasone (FLONASE) 50 MCG/ACT nasal spray, , Disp: , Rfl: 2 .  hyoscyamine (ANASPAZ) 0.125 MG TBDP disintergrating tablet, Place 0.125 mg under the tongue as needed. , Disp: , Rfl:  .  JUNEL 1/20 1-20 MG-MCG tablet, Take 1 tablet by mouth daily., Disp: , Rfl: 11 .  loratadine-pseudoephedrine (CLARITIN-D 12-HOUR) 5-120 MG tablet, Take 1 tablet by mouth at bedtime as needed for allergies., Disp: , Rfl:  .  Melatonin 3 MG CAPS, Take 3 mg by mouth at bedtime., Disp: , Rfl:  .  Minoxidil (ROGAINE WOMENS) 5 % FOAM, Apply topically., Disp: , Rfl:  .  Multiple Vitamin (MULTIVITAMIN) tablet, Take 1 tablet by mouth daily., Disp: , Rfl:  .  polyethylene glycol (MIRALAX / GLYCOLAX) packet, Take 17 g by mouth daily., Disp: , Rfl:  .  Rutin 50 MG TABS, Take 50 tablets by mouth daily. , Disp: , Rfl:  .  simvastatin (ZOCOR) 10 MG tablet, Take 10 mg by mouth every evening., Disp: , Rfl: 1 .  vitamin C (ASCORBIC ACID) 500 MG tablet, Take 1,000 mg by mouth daily. Takes gummy, not tablet, Disp: , Rfl:   PAST MEDICAL HISTORY: Past Medical History:  Diagnosis Date  . Headache   . Multiple sclerosis ([place]  . Vision abnormalities     PAST SURGICAL HISTORY: Past Surgical History:  Procedure Laterality Date  . KNEE ARTHROSCOPY Left    2006    FAMILY HISTORY: Family History  Problem Relation Age of Onset  . Hypertension Mother   . Ataxia Father   . CAD Father   . Parkinson's disease  Father   . Bladder Cancer Father     SOCIAL HISTORY:  Social History   Socioeconomic History  . Marital status: Married    Spouse name: Not on file  . Number of children: Not on file  . Years of education: Not on file  . Highest education level: Not on file  Occupational History  . Occupation: OfGlass blower/designerSocial Needs  . Financial resource strain: Not on file  . Food insecurity    Worry: Not on file    Inability: Not on file  . Transportation needs    Medical: Not on file    Non-medical: Not on file  Tobacco Use  . Smoking status: Never Smoker  . Smokeless tobacco: Never Used  Substance and Sexual Activity  . Alcohol use: Yes    Alcohol/week: 0.0 standard drinks    Comment: 2-3 beers per week/fim  . Drug use: No  . Sexual activity: Not on file  Lifestyle  . Physical activity    Days per week: Not on file  Minutes per session: Not on file  . Stress: Not on file  Relationships  . Social Herbalist on phone: Not on file    Gets together: Not on file    Attends religious service: Not on file    Active member of club or organization: Not on file    Attends meetings of clubs or organizations: Not on file    Relationship status: Not on file  . Intimate partner violence    Fear of current or ex partner: Not on file    Emotionally abused: Not on file    Physically abused: Not on file    Forced sexual activity: Not on file  Other Topics Concern  . Not on file  Social History Narrative   Right handed    Caffeine use: 1/2 cup coffee per day     PHYSICAL EXAM  There were no vitals filed for this visit.  There is no height or weight on file to calculate BMI.   General: The patient is well-developed and well-nourished and in no acute distress    Neurologic Exam  Mental status: The patient is alert and oriented x 3 at the time of the examination. The patient has apparent normal recent and remote memory, with an apparently normal attention span and  concentration ability.   Speech is normal.  Cranial nerves: Extraocular movements are full.   Facial strength and sensation is normal.  Trapezius strength is normal.  No dysarthria is noted.  No obvious hearing deficits are noted.                                                                                                                                                                                        Motor:  Muscle bulk is normal.  She has increased muscle tone in the legs, right greater than left.   Strength is 5/5 in the arms and the left leg. Strength is 4+/5 at the right ankle and toes.   Sensory:  There was intact sensation to touch and vibration in the arms and legs.      Coordination: She has good finger-nose-finger bilaterally.  Heel-to-shin is performed well on the left but reduced on the right.  Gait and station: Station is normal.   There is a right foot drop..  The gait is mildly wide.  Tandem gait is moderately wide and she dragged the right foot more.  Romberg is negative.  Reflexes: Deep tendon reflexes are increased on both legs with spread at the knees and non-sustained clonus at the right ankle.          Richard A. Sater,  MD, PhD 4/60/4799, 8:72 PM Certified in Neurology, Clinical Neurophysiology, Sleep Medicine, Pain Medicine and Neuroimaging  [facility] [address] [place], [place] [address] [phone]

## 2018-08-16 ENCOUNTER — Telehealth: Payer: Self-pay | Admitting: Neurology

## 2018-08-16 ENCOUNTER — Other Ambulatory Visit: Payer: Self-pay | Admitting: Neurology

## 2018-08-16 NOTE — Telephone Encounter (Signed)
no to the covid-19 questions MR Brain wo contrast Dr. Cheree Ditto Auth: [id number] (exp. 08/16/18 to 02/11/19). Patient is scheduled at [facility] for 09/27/18.

## 2018-08-30 DIAGNOSIS — Z1159 Encounter for screening for other viral diseases: Secondary | ICD-10-CM | POA: Diagnosis not present

## 2018-09-11 DIAGNOSIS — Z3041 Encounter for surveillance of contraceptive pills: Secondary | ICD-10-CM | POA: Diagnosis not present

## 2018-09-27 ENCOUNTER — Ambulatory Visit: Payer: BC Managed Care – PPO

## 2018-09-27 ENCOUNTER — Other Ambulatory Visit: Payer: Self-pay

## 2018-09-27 DIAGNOSIS — G35 Multiple sclerosis: Secondary | ICD-10-CM | POA: Diagnosis not present

## 2018-10-02 ENCOUNTER — Telehealth: Payer: Self-pay | Admitting: *Deleted

## 2018-10-02 NOTE — Telephone Encounter (Signed)
-----   Message from Britt Bottom, MD sent at 09/29/2018  4:38 PM EDT ----- Please let her know that the MRI of the brain did not show any new MS lesions.

## 2018-10-02 NOTE — Telephone Encounter (Signed)
Called and spoke with pt about MRI results per Dr. Felecia Shelling note. She verbalized understanding. She will be setting up [employer] account in October and wanted to know if she needed repeat MRI next year. I placed on hold and spoke with Dr. Felecia Shelling. Relayed per Dr. Felecia Shelling ok to push next MRI out to early 2022. She will call back if any further questions/concerns.

## 2018-11-01 ENCOUNTER — Other Ambulatory Visit: Payer: Self-pay | Admitting: Neurology

## 2018-12-26 ENCOUNTER — Other Ambulatory Visit: Payer: Self-pay | Admitting: Neurology

## 2019-01-16 DIAGNOSIS — Z23 Encounter for immunization: Secondary | ICD-10-CM | POA: Diagnosis not present

## 2019-01-16 DIAGNOSIS — E7800 Pure hypercholesterolemia, unspecified: Secondary | ICD-10-CM | POA: Diagnosis not present

## 2019-01-16 DIAGNOSIS — Z0000 Encounter for general adult medical examination without abnormal findings: Secondary | ICD-10-CM | POA: Diagnosis not present

## 2019-01-16 DIAGNOSIS — R7303 Prediabetes: Secondary | ICD-10-CM | POA: Diagnosis not present

## 2019-01-24 ENCOUNTER — Other Ambulatory Visit: Payer: Self-pay

## 2019-01-24 ENCOUNTER — Encounter: Payer: Self-pay | Admitting: Neurology

## 2019-01-24 ENCOUNTER — Ambulatory Visit: Payer: BC Managed Care – PPO | Admitting: Neurology

## 2019-01-24 VITALS — BP 131/78 | HR 110 | Temp 97.1°F | Ht 69.0 in | Wt 194.5 lb

## 2019-01-24 DIAGNOSIS — M21371 Foot drop, right foot: Secondary | ICD-10-CM

## 2019-01-24 DIAGNOSIS — R5383 Other fatigue: Secondary | ICD-10-CM

## 2019-01-24 DIAGNOSIS — M2550 Pain in unspecified joint: Secondary | ICD-10-CM

## 2019-01-24 DIAGNOSIS — G35 Multiple sclerosis: Secondary | ICD-10-CM

## 2019-01-24 NOTE — Progress Notes (Addendum)
[facility]  PATIENT: Lindsay Carter DOB: 03-28-1970  REFERRING DOCTOR OR PCP:  Milagros Evener [facility])   SOURCE: patient and [facility] records                    _________________________________   HISTORICAL  CHIEF COMPLAINT:  Chief Complaint  Patient presents with  . Follow-up    RM 12, alone. Last seen 08/15/2018. No new sx.   . Multiple Sclerosis    On copaxone    HISTORY OF PRESENT ILLNESS:  Lindsay Carter is a 48 y.o. woman with relapsing remitting multiple sclerosis.   Update 01/24/2019: Her MS is stable and no exacerbations.   She is on Copaxone 40 mg twice weekly.  She has no new symptoms.  Her right leg is stiffer today with colder weather but overall strength,gait and sensation are unchanged.  Her right hand and arm are fine There is some tingling in the right leg and right hand at times.   Bladder function is fine.     Vision is fine      Fatigue is mild and actually better since able to work at home.   Sleep is variable.   Her mother has cognitive issues and frequent UTI's.    She has had a little more anxiety with her mom's health issues but no depression.       Joint pain is doing well with etodolac  Update 08/15/2018 (virtual) She is on Copaxone 40 mg tiw.    She is tolerating it well if she takes a Benadryl before each shot.   Due to right leg weakness, her gait is off and unbalanced.  She can walk about 150 to 200 yards without stopping.   Some stumbles but no falls.    She uses the rail on stairs.   Her right side is weak and some spasticity and left is fine.   Occasionally she has numbness in her right leg that is always present.     Bladder function is doing well.    Vision is fine.     She has mild fatigue but feels it is better sinceeels fatigue is tolerable most days but it prevents her form doing errands other days.   Mood is ok.   Cognition is ok unless she is tired.   Insomnia improved with melatonin.   She is  working from her home now due to Covid-19    ________  Update 01/04/18: She is on Copaxone 40 mg tiw.    She is tolerating it well if she takes a Benadryl before each shot.   Her gait is stable.  She can walk about 200 yards without stopping.   Some stumbles but no falls.    She uses the rail on stairs.   Her right side is weak and left is fine.   Occasionally she has numbness in her right hand that is random.     Bladder function is doing well.    Vision is fine.   She feels fatigue is tolerable most days but it prevents her form doing errands other days.   Mood is ok.   Cognition is ok unless she is tired.   She is sleeping well with melatonin.   I showed her images from her last MRI.  Most of her disability is likely coming from a right lateral focus in the spinal cord around C4.  Update 06/28/2017:  She feels that her MS has been stable.  She is on Copaxone and she takes 40 mg twice a week.  She tolerates it well and has no exacerbations.  The last MRIs were performed 08/04/2016 and showed no new lesions.  She has a single focus in the cervical spine at C4 to the right.   1 of her main problems is difficulty with gait due to a right foot drop.  This is unchanged.  She also notes some right-sided numbness and tingling.  This is stable.  She has no difficulty with her vision or bladder.  She has some fatigue that fluctuates.    This does not prevent her from doing what she needs to do.  She is sleeping well most nights.  She has no difficulty with mood or cognition.   She works in an office.    She has some joint pain that was worse when she had the flu but she notes that daily ketorolac and as needed Tylenol helps most times it flares up.      Update 12/29/2016:  She is on Copaxone 40 mg twice a week. She tolerates well and has had no exacerbations. She had MRIs in 08/04/2016 of the brain and spinal cord. On the brain she has multiple T2/FLAIR hyperintense foci consistent with MS. There is no change  when compared to 2016 MRI. Cervical spine, there is a small focus to the right at C4 in the spinal cord otherwise appears normal. This is a chronic lesion. Images are personally reviewed with Ms. Chalk.    She notes no change in gait or strength.   She has some right sided numbness but no sustained tingling x years.     Vision is fine.   Bladder if fine.   Fatigue is doing well.    She sleeps well most nights.   She denies problems with mood or cognition. She continues to work full-time.  From 06/23/2016:     MS:   She is on Copaxone and tolerates it well.   She has not had any definite exacerbations.  Her last MRI 09/03/2014 showed Chronic demyelinating plaques consistent with MS in the hemispheres with a relatively low plaque burden. There were no enhancing foci.   A thoracic spine from 2009 shows plaque within the spinal cord adjacent to T4T5 and T6-T7.    Gait/strength/sensation: She has right leg spasticity and weakness affecting her gait.  She stumbles some and has occasional falls. She feels her gait is worse later in the day. She has a right foot drop that started with her initial transverse myelitis in 2009.    She is thinking about getting an AFO brace but would like to hold off right now.   Her foot drop is worse with longer distances.    She has no arm weakness.   She notes tingling in her right hand at times that will occur some days and some night.   She denies any significant numbness in either leg or arm.      Bladder/bowel:  Bladder function is stable with mild urinary frequency and urgency. She does not have any recent incontinence. Three times in the past she has had bowel urgency incontinence events.   Vision: She denies any difficulty with vision. There is no diplopia or eye pain.  Fatigue/sleep: She notes physical > cognitive fatigue.   This is often mild in the morning but may worsen later in the day. .   In the past, she tried modafinil.    Modafinil caused  headache and she  stopped. It had helped.   She had some insomnia with early awakening some days but she falls asleep easily.  Mood/cognition. She denies any significant depression or anxiety. She has not had any major cognitive dysfunction but feels processing speed has slowed some.  She uses lists as reminders.  Joint pain:   She repors the multiple joint pain is doing better.    Rheum labs were negative in 2017.    She is on Lodine  She filled the tramadol script but has not taken any.    MS History:   Prior to 2010, she presented with several episodes of numbness and MRI of the spine was reportedly normal (however on my recent review there appear to be 2 thoracic plaques at T4T5 and T6T7).   In 2010, she had an episode of limping and was referred to me.   MRI of the spine showed one focus and MRI of the brain showed multiple foci consistent with t multiple sclerosis.  There was one enhancing plaque in the left posterior frontal lobe.   She was started on Copaxone and noted mild lipoatrophy. Of note, she is HLA DR 1501 positive. She was clinically stable, she initially went to Copaxone 20 mg every other day in 2012 and then switched to 40 mg 3 times a week in 2014. In 2015 she switch to 40 mg twice a week.     REVIEW OF SYSTEMS: Constitutional: No fevers, chills, sweats, or change in appetite.   Has fatigue Eyes: No visual changes, double vision, eye pain Ear, nose and throat: No hearing loss, ear pain, nasal congestion, sore throat Cardiovascular: No chest pain, palpitations Respiratory: No shortness of breath at rest or with exertion.   No wheezes GastrointestinaI: No nausea, vomiting, diarrhea, abdominal pain, fecal incontinence Genitourinary: No dysuria, urinary retention.  Minimal frequency and minimal stress incontinence.  No nocturia. Musculoskeletal: No neck pain.  Mild Lower back pain Integumentary: No rash, pruritus, skin lesions Neurological: as above Psychiatric: No depression at this time.  No  anxiety Endocrine: No palpitations, diaphoresis, change in appetite, change in weigh or increased thirst Hematologic/Lymphatic: No anemia, purpura, petechiae. Allergic/Immunologic: No itchy/runny eyes, nasal congestion, recent allergic reactions, rashes  ALLERGIES: Allergies  Allergen Reactions  . Erythromycin Nausea And Vomiting    HOME MEDICATIONS:  Current Outpatient Medications:  .  acetaminophen (TYLENOL) 500 MG tablet, Take 1,000 mg by mouth every 6 (six) hours as needed., Disp: , Rfl:  .  cetirizine (ZYRTEC) 10 MG tablet, Take 10 mg by mouth daily., Disp: , Rfl: 4 .  cholecalciferol (VITAMIN D) 1000 UNITS tablet, Take 2,000 Units by mouth daily., Disp: , Rfl:  .  COPAXONE 40 MG/ML SOSY, Inject 75m under the skin 2-3 times a week (Patient taking differently: Inject into the skin 2 (two) times a week. Inject 478munder the skin 2-3 times a week), Disp: 36 Syringe, Rfl: 3 .  Cranberry 125 MG TABS, Take 125 mg by mouth daily., Disp: , Rfl:  .  etodolac (LODINE) 400 MG tablet, TAKE 1 TABLET BY MOUTH TWICE A DAY, Disp: 180 tablet, Rfl: 0 .  fluticasone (FLONASE) 50 MCG/ACT nasal spray, , Disp: , Rfl: 2 .  hyoscyamine (ANASPAZ) 0.125 MG TBDP disintergrating tablet, Place 0.125 mg under the tongue as needed. , Disp: , Rfl:  .  JUNEL 1/20 1-20 MG-MCG tablet, Take 1 tablet by mouth daily., Disp: , Rfl: 11 .  loratadine-pseudoephedrine (CLARITIN-D 12-HOUR) 5-120 MG tablet, Take 1  tablet by mouth at bedtime as needed for allergies., Disp: , Rfl:  .  Melatonin 3 MG CAPS, Take 3 mg by mouth at bedtime., Disp: , Rfl:  .  Minoxidil (ROGAINE WOMENS) 5 % FOAM, Apply topically., Disp: , Rfl:  .  Multiple Vitamin (MULTIVITAMIN) tablet, Take 1 tablet by mouth daily., Disp: , Rfl:  .  polyethylene glycol (MIRALAX / GLYCOLAX) packet, Take 17 g by mouth daily., Disp: , Rfl:  .  Rutin 50 MG TABS, Take 50 tablets by mouth daily. , Disp: , Rfl:  .  simvastatin (ZOCOR) 10 MG tablet, Take 10 mg by mouth  every evening., Disp: , Rfl: 1 .  vitamin C (ASCORBIC ACID) 500 MG tablet, Take 1,000 mg by mouth daily. Takes gummy, not tablet, Disp: , Rfl:   PAST MEDICAL HISTORY: Past Medical History:  Diagnosis Date  . Headache   . Multiple sclerosis ([place])   . Vision abnormalities     PAST SURGICAL HISTORY: Past Surgical History:  Procedure Laterality Date  . KNEE ARTHROSCOPY Left    2006    FAMILY HISTORY: Family History  Problem Relation Age of Onset  . Hypertension Mother   . Ataxia Father   . CAD Father   . Parkinson's disease Father   . Bladder Cancer Father     SOCIAL HISTORY:  Social History   Socioeconomic History  . Marital status: Married    Spouse name: Not on file  . Number of children: Not on file  . Years of education: Not on file  . Highest education level: Not on file  Occupational History  . Occupation: Glass blower/designer  Social Needs  . Financial resource strain: Not on file  . Food insecurity    Worry: Not on file    Inability: Not on file  . Transportation needs    Medical: Not on file    Non-medical: Not on file  Tobacco Use  . Smoking status: Never Smoker  . Smokeless tobacco: Never Used  Substance and Sexual Activity  . Alcohol use: Yes    Alcohol/week: 0.0 standard drinks    Comment: 2-3 beers per week/fim  . Drug use: No  . Sexual activity: Not on file  Lifestyle  . Physical activity    Days per week: Not on file    Minutes per session: Not on file  . Stress: Not on file  Relationships  . Social Herbalist on phone: Not on file    Gets together: Not on file    Attends religious service: Not on file    Active member of club or organization: Not on file    Attends meetings of clubs or organizations: Not on file    Relationship status: Not on file  . Intimate partner violence    Fear of current or ex partner: Not on file    Emotionally abused: Not on file    Physically abused: Not on file    Forced sexual activity: Not on  file  Other Topics Concern  . Not on file  Social History Narrative   Right handed    Caffeine use: 1/2 cup coffee per day     PHYSICAL EXAM  Vitals:   01/24/19 0818  BP: 131/78  Pulse: (!) 110  Temp: (!) 97.1 F (36.2 C)  SpO2: 98%  Weight: 194 lb 8 oz (88.2 kg)  Height: '5\' 9"'  (1.753 m)    Body mass index is 28.72 kg/m.   General: The  patient is well-developed and well-nourished and in no acute distress    Neurologic Exam  Mental status: The patient is alert and oriented x 3 at the time of the examination. The patient has apparent normal recent and remote memory, with an apparently normal attention span and concentration ability.   Speech is normal.  Cranial nerves: Extraocular movements are full.   Facial strength strength is normal.    No obvious hearing deficits are noted.                                                                                                                                                                                        Motor:  Muscle bulk is normal.  She has increased muscle tone in the legs, right greater than left.   Strength is 5/5 in the arms and the left leg. Strength is 4+/5 at the right ankle and 4/5 at the toes.   Sensory:  There was intact sensation to touch and vibration in the arms and legs.      Coordination: She has good finger-nose-finger bilaterally.  Heel-to-shin is performed well on the left but reduced on the right.  Gait and station: Station is normal.   There is a right foot drop..  The gait is mildly wide.  Tandem gait is moderately wide    Romberg is negative.  Reflexes: Deep tendon reflexes are increased on both legs with spread at the knees and non-sustained clonus at the right ankle.         Multiple sclerosis (H[facility])  Right foot drop  Other fatigue  Multiple joint pain  1.   Continue Copaxone.  Last MRI in August was stable can go to late 2021 or 2022 for next MRI 2.   Continue etodolac 3.  Stay  active and exercise as tolerated. 4.   Return to see me in 6 months or sooner if there are new or worsening neurologic symptoms. Clarisa Danser A. Felecia Shelling, MD, PhD 04/22/3336, 3:29 AM Certified in Neurology, Clinical Neurophysiology, Sleep Medicine, Pain Medicine and Neuroimaging  [facility] [address], [place], [place] [address] [phone]

## 2019-03-01 DIAGNOSIS — L718 Other rosacea: Secondary | ICD-10-CM | POA: Diagnosis not present

## 2019-03-01 DIAGNOSIS — D2271 Melanocytic nevi of right lower limb, including hip: Secondary | ICD-10-CM | POA: Diagnosis not present

## 2019-03-01 DIAGNOSIS — D2261 Melanocytic nevi of right upper limb, including shoulder: Secondary | ICD-10-CM | POA: Diagnosis not present

## 2019-03-01 DIAGNOSIS — D2262 Melanocytic nevi of left upper limb, including shoulder: Secondary | ICD-10-CM | POA: Diagnosis not present

## 2019-03-09 ENCOUNTER — Telehealth: Payer: Self-pay | Admitting: Neurology

## 2019-03-09 NOTE — Telephone Encounter (Signed)
Pt has called requesting a letter stating her diagnosis for Covid-19 vaccine

## 2019-03-12 ENCOUNTER — Encounter: Payer: Self-pay | Admitting: *Deleted

## 2019-03-12 NOTE — Telephone Encounter (Signed)
Letter printed, waiting on MD signature °

## 2019-03-12 NOTE — Telephone Encounter (Signed)
Called pt. Advised letter ready. Pt will come by to pick up, I placed up front by check in for pick up.

## 2019-03-22 ENCOUNTER — Other Ambulatory Visit: Payer: Self-pay | Admitting: Neurology

## 2019-04-04 ENCOUNTER — Other Ambulatory Visit: Payer: Self-pay | Admitting: Neurology

## 2019-04-10 ENCOUNTER — Other Ambulatory Visit: Payer: Self-pay | Admitting: *Deleted

## 2019-04-10 DIAGNOSIS — G35 Multiple sclerosis: Secondary | ICD-10-CM

## 2019-04-10 MED ORDER — COPAXONE 40 MG/ML ~~LOC~~ SOSY: PREFILLED_SYRINGE | SUBCUTANEOUS | 3 refills | Status: DC

## 2019-04-10 NOTE — Progress Notes (Signed)
Took call from phone staff and spoke with Victorino Dike with [facility]. Rx sent for Copaxone 04/04/19 not showing as DAW on their end. I resent rx again with DAW. She will call back if anything further needed.

## 2019-04-19 ENCOUNTER — Telehealth: Payer: Self-pay | Admitting: Neurology

## 2019-04-19 NOTE — Telephone Encounter (Signed)
Called, LVM for pt asking her to call back to discuss. We can try and do PA via insurance for Copaxone. Just need to verify we have her updated insurance info for this year.   Or, if she prefers to switch to generic, she will need to stop by the office to sign a start form for generic: glatiramer acetate.

## 2019-04-19 NOTE — Telephone Encounter (Signed)
I called pt back, she had called and spoke with phone staff earlier. She received letter from insurance that stated she must try/fail generic before they will continue to cover copaxone. She is agreeable to make this change. She will come next week on Tuesday to sign start form for glatiramer acetate. I verified she still has [employer] and info on card still up to date.

## 2019-04-19 NOTE — Telephone Encounter (Signed)
Pt has asked for a call from RN to discuss that her insruance company will no longer cover the brand name version of COPAXONE 40 MG/ML SOSY.  Pt is needing script for generic, please call

## 2019-04-24 NOTE — Telephone Encounter (Signed)
Submitted PA on CMM. [id number]. Received instant approval.

## 2019-04-24 NOTE — Telephone Encounter (Signed)
Pt came and signed glatiramer acetate start form today. I faxed to [facility] MS at [phone]. Received fax confirmation.  She has about 2 months left of brand copaxone that she plans to use up prior to switching to generic. She will let [facility] MS know when they call her as well.

## 2019-04-25 NOTE — Telephone Encounter (Signed)
Received fax from [facility] advocate that pt case cancelled. I called and spoke with Elnita Maxwell at [phone]. She clarified that they spoke with pt yesterday who informed them that she will use up brand Copaxone that she has first. They will call her on 06/19/19 to get her initiated on glatiramer actetate. She will stay active in their system and start form good for 1 yr. SP: [facility]. Nothing further needed.

## 2019-05-14 DIAGNOSIS — R062 Wheezing: Secondary | ICD-10-CM | POA: Diagnosis not present

## 2019-05-14 DIAGNOSIS — J3089 Other allergic rhinitis: Secondary | ICD-10-CM | POA: Diagnosis not present

## 2019-05-28 ENCOUNTER — Other Ambulatory Visit: Payer: Self-pay | Admitting: Family Medicine

## 2019-05-28 DIAGNOSIS — Z1231 Encounter for screening mammogram for malignant neoplasm of breast: Secondary | ICD-10-CM

## 2019-06-19 ENCOUNTER — Other Ambulatory Visit: Payer: Self-pay

## 2019-06-19 ENCOUNTER — Ambulatory Visit
Admission: RE | Admit: 2019-06-19 | Discharge: 2019-06-19 | Disposition: A | Discharge status: Home / Self Care | Payer: BC Managed Care – PPO | Source: Ambulatory Visit | Attending: Family Medicine | Admitting: Family Medicine

## 2019-06-19 DIAGNOSIS — Z1231 Encounter for screening mammogram for malignant neoplasm of breast: Secondary | ICD-10-CM | POA: Diagnosis not present

## 2019-07-17 DIAGNOSIS — E7800 Pure hypercholesterolemia, unspecified: Secondary | ICD-10-CM | POA: Diagnosis not present

## 2019-07-25 ENCOUNTER — Encounter: Payer: Self-pay | Admitting: Family Medicine

## 2019-07-25 ENCOUNTER — Ambulatory Visit: Payer: BC Managed Care – PPO | Admitting: Family Medicine

## 2019-07-25 ENCOUNTER — Other Ambulatory Visit: Payer: Self-pay

## 2019-07-25 VITALS — BP 140/82 | HR 116 | Ht 69.0 in | Wt 197.0 lb

## 2019-07-25 DIAGNOSIS — M21371 Foot drop, right foot: Secondary | ICD-10-CM | POA: Diagnosis not present

## 2019-07-25 DIAGNOSIS — M2550 Pain in unspecified joint: Secondary | ICD-10-CM | POA: Diagnosis not present

## 2019-07-25 DIAGNOSIS — G35 Multiple sclerosis: Secondary | ICD-10-CM | POA: Diagnosis not present

## 2019-07-25 NOTE — Progress Notes (Signed)
I have read the note, and I agree with the clinical assessment and plan.  Tasmin Exantus A. Leonardo Makris, MD, PhD, FAAN Certified in Neurology, Clinical Neurophysiology, Sleep Medicine, Pain Medicine and Neuroimaging  [facility] [address], [address] [place], [place] [address] [phone]  

## 2019-07-25 NOTE — Progress Notes (Signed)
PATIENT: Lindsay Carter DOB: [birth year]  REASON FOR VISIT: follow up HISTORY FROM: patient  Chief Complaint  Patient presents with  . Follow-up    rm 3 here for a f/u on MS.     HISTORY OF PRESENT ILLNESS: Today 07/25/19 Lindsay Carter is a 49 y.o. female here today for follow up for RRMS. She continues Capaxone injections. MRI stable in 09/2018.  She feels that she is doing well. She does continue to have tingling of right leg. No gait changes. No new or worsening numbness or weakness. Hands have been better.   She does take etodolac 18m BID for generalized arthritic pain. She feels that this helps.   She has been vaccinated. She is concerned about returning to work and social activities as others around her have not been vaccinated. She is followed every 6 months by PCP. Labs reportedly normal 2 weeks ago.    HISTORY: (copied from Dr SGarth Bignessnote on 01/24/2019)  Lindsay Carter a 49y.o. woman with relapsing remitting multiple sclerosis.   Update 01/24/2019: Her MS is stable and no exacerbations.   She is on Copaxone 40 mg twice weekly.  She has no new symptoms.  Her right leg is stiffer today with colder weather but overall strength,gait and sensation are unchanged.  Her right hand and arm are fine There is some tingling in the right leg and right hand at times.   Bladder function is fine.     Vision is fine      Fatigue is mild and actually better since able to work at home.   Sleep is variable.   Her mother has cognitive issues and frequent UTI's.    She has had a little more anxiety with her mom's health issues but no depression.       Joint pain is doing well with etodolac  Update 08/15/2018 (virtual) She is on Copaxone 40 mg tiw.    She is tolerating it well if she takes a Benadryl before each shot.   Due to right leg weakness, her gait is off and unbalanced.  She can walk about 150 to 200 yards without stopping.   Some stumbles but no falls.    She uses the  rail on stairs.   Her right side is weak and some spasticity and left is fine.   Occasionally she has numbness in her right leg that is always present.     Bladder function is doing well.    Vision is fine.     She has mild fatigue but feels it is better sinceeels fatigue is tolerable most days but it prevents her form doing errands other days.   Mood is ok.   Cognition is ok unless she is tired.   Insomnia improved with melatonin.   She is working from her home now due to Covid-19   ________  Update 01/04/18: She is on Copaxone 40 mg tiw.    She is tolerating it well if she takes a Benadryl before each shot.   Her gait is stable.  She can walk about 200 yards without stopping.   Some stumbles but no falls.    She uses the rail on stairs.   Her right side is weak and left is fine.   Occasionally she has numbness in her right hand that is random.     Bladder function is doing well.    Vision is fine.   She feels fatigue is tolerable  most days but it prevents her form doing errands other days.   Mood is ok.   Cognition is ok unless she is tired.   She is sleeping well with melatonin.   I showed her images from her last MRI.  Most of her disability is likely coming from a right lateral focus in the spinal cord around C4.  Update 06/28/2017:  She feels that her MS has been stable.  She is on Copaxone and she takes 40 mg twice a week.  She tolerates it well and has no exacerbations.  The last MRIs were performed 08/04/2016 and showed no new lesions.  She has a single focus in the cervical spine at C4 to the right.   1 of her main problems is difficulty with gait due to a right foot drop.  This is unchanged.  She also notes some right-sided numbness and tingling.  This is stable.  She has no difficulty with her vision or bladder.  She has some fatigue that fluctuates.    This does not prevent her from doing what she needs to do.  She is sleeping well most nights.  She has no difficulty with mood or  cognition.   She works in an office.    She has some joint pain that was worse when she had the flu but she notes that daily ketorolac and as needed Tylenol helps most times it flares up.      Update 12/29/2016:  She is on Copaxone 40 mg twice a week. She tolerates well and has had no exacerbations. She had MRIs in 08/04/2016 of the brain and spinal cord. On the brain she has multiple T2/FLAIR hyperintense foci consistent with MS. There is no change when compared to 2016 MRI. Cervical spine, there is a small focus to the right at C4 in the spinal cord otherwise appears normal. This is a chronic lesion. Images are personally reviewed with Lindsay Carter.    She notes no change in gait or strength.   She has some right sided numbness but no sustained tingling x years.     Vision is fine.   Bladder if fine.   Fatigue is doing well.    She sleeps well most nights.   She denies problems with mood or cognition. She continues to work full-time.  From 06/23/2016:     MS:   She is on Copaxone and tolerates it well.   She has not had any definite exacerbations.  Her last MRI 09/03/2014 showed Chronic demyelinating plaques consistent with MS in the hemispheres with a relatively low plaque burden. There were no enhancing foci.   A thoracic spine from 2009 shows plaque within the spinal cord adjacent to T4T5 and T6-T7.    Gait/strength/sensation: She has right leg spasticity and weakness affecting her gait.  She stumbles some and has occasional falls. She feels her gait is worse later in the day. She has a right foot drop that started with her initial transverse myelitis in 2009.    She is thinking about getting an AFO brace but would like to hold off right now.   Her foot drop is worse with longer distances.    She has no arm weakness.   She notes tingling in her right hand at times that will occur some days and some night.   She denies any significant numbness in either leg or arm.      Bladder/bowel:  Bladder  function is stable with mild urinary frequency  and urgency. She does not have any recent incontinence. Three times in the past she has had bowel urgency incontinence events.   Vision: She denies any difficulty with vision. There is no diplopia or eye pain.  Fatigue/sleep: She notes physical > cognitive fatigue.   This is often mild in the morning but may worsen later in the day. .   In the past, she tried modafinil.    Modafinil caused  headache and she stopped. It had helped.   She had some insomnia with early awakening some days but she falls asleep easily.  Mood/cognition. She denies any significant depression or anxiety. She has not had any major cognitive dysfunction but feels processing speed has slowed some.  She uses lists as reminders.  Joint pain:   She repors the multiple joint pain is doing better.    Rheum labs were negative in 2017.    She is on Lodine  She filled the tramadol script but has not taken any.    MS History:   Prior to 2010, she presented with several episodes of numbness and MRI of the spine was reportedly normal (however on my recent review there appear to be 2 thoracic plaques at T4T5 and T6T7).   In 2010, she had an episode of limping and was referred to me.   MRI of the spine showed one focus and MRI of the brain showed multiple foci consistent with t multiple sclerosis.  There was one enhancing plaque in the left posterior frontal lobe.   She was started on Copaxone and noted mild lipoatrophy. Of note, she is HLA DR 1501 positive. She was clinically stable, she initially went to Copaxone 20 mg every other day in 2012 and then switched to 40 mg 3 times a week in 2014. In 2015 she switch to 40 mg twice a week.     REVIEW OF SYSTEMS: Out of a complete 14 system review of symptoms, the patient complains only of the following symptoms, arthritic pain, tingling right leg, and all other reviewed systems are negative.  ALLERGIES: Allergies  Allergen Reactions  .  Erythromycin Nausea And Vomiting    HOME MEDICATIONS: Outpatient Medications Prior to Visit  Medication Sig Dispense Refill  . acetaminophen (TYLENOL) 500 MG tablet Take 1,000 mg by mouth every 6 (six) hours as needed.    . cetirizine (ZYRTEC) 10 MG tablet Take 10 mg by mouth daily.  4  . cholecalciferol (VITAMIN D) 1000 UNITS tablet Take 2,000 Units by mouth daily.    Marland Kitchen COPAXONE 40 MG/ML SOSY INJECT 40 MG UNDER THE SKIN 2 TO 3 TIMES A WEEK 36 mL 3  . Cranberry 125 MG TABS Take 125 mg by mouth daily.    Marland Kitchen etodolac (LODINE) 400 MG tablet TAKE 1 TABLET BY MOUTH TWICE A DAY 180 tablet 1  . fluticasone (FLONASE) 50 MCG/ACT nasal spray   2  . hyoscyamine (ANASPAZ) 0.125 MG TBDP disintergrating tablet Place 0.125 mg under the tongue as needed.     Lenda Kelp 1/20 1-20 MG-MCG tablet Take 1 tablet by mouth daily.  11  . loratadine-pseudoephedrine (CLARITIN-D 12-HOUR) 5-120 MG tablet Take 1 tablet by mouth at bedtime as needed for allergies.    . Melatonin 3 MG CAPS Take 3 mg by mouth at bedtime.    . Minoxidil (ROGAINE WOMENS) 5 % FOAM Apply topically.    . Multiple Vitamin (MULTIVITAMIN) tablet Take 1 tablet by mouth daily.    . polyethylene glycol (MIRALAX / GLYCOLAX)  packet Take 17 g by mouth daily.    . Rutin 50 MG TABS Take 50 tablets by mouth daily.     . simvastatin (ZOCOR) 10 MG tablet Take 10 mg by mouth every evening.  1  . vitamin C (ASCORBIC ACID) 500 MG tablet Take 1,000 mg by mouth daily. Takes gummy, not tablet     No facility-administered medications prior to visit.    PAST MEDICAL HISTORY: Past Medical History:  Diagnosis Date  . Headache   . Multiple sclerosis ([place])   . Vision abnormalities     PAST SURGICAL HISTORY: Past Surgical History:  Procedure Laterality Date  . KNEE ARTHROSCOPY Left    2006    FAMILY HISTORY: Family History  Problem Relation Age of Onset  . Hypertension Mother   . Ataxia Father   . CAD Father   . Parkinson's disease Father   . Bladder  Cancer Father   . Breast cancer Paternal Aunt 58  . Breast cancer Maternal Grandmother        post menopause     SOCIAL HISTORY: Social History   Socioeconomic History  . Marital status: Married    Spouse name: Not on file  . Number of children: Not on file  . Years of education: Not on file  . Highest education level: Not on file  Occupational History  . Occupation: Glass blower/designer  Tobacco Use  . Smoking status: Never Smoker  . Smokeless tobacco: Never Used  Substance and Sexual Activity  . Alcohol use: Yes    Alcohol/week: 0.0 standard drinks    Comment: 2-3 beers per week/fim  . Drug use: No  . Sexual activity: Not on file  Other Topics Concern  . Not on file  Social History Narrative   Right handed    Caffeine use: 1/2 cup coffee per day   Social Determinants of Health   Financial Resource Strain:   . Difficulty of Paying Living Expenses:   Food Insecurity:   . Worried About Charity fundraiser in the Last Year:   . Arboriculturist in the Last Year:   Transportation Needs:   . Film/video editor (Medical):   Marland Kitchen Lack of Transportation (Non-Medical):   Physical Activity:   . Days of Exercise per Week:   . Minutes of Exercise per Session:   Stress:   . Feeling of Stress :   Social Connections:   . Frequency of Communication with Friends and Family:   . Frequency of Social Gatherings with Friends and Family:   . Attends Religious Services:   . Active Member of Clubs or Organizations:   . Attends Archivist Meetings:   Marland Kitchen Marital Status:   Intimate Partner Violence:   . Fear of Current or Ex-Partner:   . Emotionally Abused:   Marland Kitchen Physically Abused:   . Sexually Abused:       PHYSICAL EXAM  Vitals:   07/25/19 0843  BP: 140/82  Pulse: (!) 116  Weight: 197 lb (89.4 kg)  Height: '5\' 9"'  (1.753 m)   Body mass index is 29.09 kg/m.  Generalized: Well developed, in no acute distress  Neurological examination  Mentation: Alert oriented to  time, place, history taking. Follows all commands speech and language fluent Cranial nerve II-XII: Pupils were equal round reactive to light. Extraocular movements were full, visual field were full on confrontational test. Facial sensation and strength were normal. Uvula tongue midline. Head turning and shoulder shrug  were normal  and symmetric. Motor: The motor testing reveals 5 over 5 strength of right upper, left upper and lower extremity, 4+/5 right hip flexion, extension . Good symmetric motor tone is noted throughout.  Sensory: Sensory testing is intact to soft touch on all 4 extremities. No evidence of extinction is noted.  Coordination: Cerebellar testing reveals good finger-nose-finger and heel-to-shin bilaterally.  Gait and station: Gait is slightly wide, right foot drop noted.   Reflexes: Deep tendon reflexes are symmetric and normal bilaterally.   DIAGNOSTIC DATA (LABS, IMAGING, TESTING) - I reviewed patient records, labs, notes, testing and imaging myself where available.  No flowsheet data found.   No results found for: WBC, HGB, HCT, MCV, PLT No results found for: NA, K, CL, CO2, GLUCOSE, BUN, CREATININE, CALCIUM, PROT, ALBUMIN, AST, ALT, ALKPHOS, BILITOT, GFRNONAA, GFRAA No results found for: CHOL, HDL, LDLCALC, LDLDIRECT, TRIG, CHOLHDL No results found for: HGBA1C No results found for: VITAMINB12 No results found for: TSH     ASSESSMENT AND PLAN 49 y.o. year old female  has a past medical history of Headache, Multiple sclerosis ([place]), and Vision abnormalities. here with     ICD-10-CM   1. Relapsing remitting multiple sclerosis ([place])  G35   2. Multiple joint pain  M25.50   3. Right foot drop  M21.371     Lindsay Carter is doing very well today.  We will continue Copaxone injections as prescribed.  She will continue etodolac twice daily.  I will request most recent labs from PCP for review.  No history of kidney disease.  She was encouraged to stay as active as possible.  Adequate  hydration and well-balanced diet encouraged.  I have signed for handicap placard today.  She is fully vaccinated.  We have discussed CDC recommendations and precautions.  She may continue to wear her mask if she feels that this is protective.  Standard precautions with appropriate handwashing and avoidance of contact with those with known viral symptoms reviewed and encouraged.  She will follow-up with Dr. Ladonna Snide in 6 months, sooner if needed.  She verbalizes understanding and agreement with this plan.   No orders of the defined types were placed in this encounter.    No orders of the defined types were placed in this encounter.     I spent 30 minutes with the patient. 50% of this time was spent counseling and educating patient on plan of care and medications.     Debbora Presto, FNP-C 07/25/2019, 10:23 AM [facility] [address] [place], [place] [address] [phone]

## 2019-07-25 NOTE — Patient Instructions (Addendum)
Continue Capaxone and etodolac. We will request labs from PCP. Stay active and well hydrated.   Follow up with Dr Epimenio Foot in 6 months     Multiple Sclerosis Multiple sclerosis (MS) is a disease of the brain, spinal cord, and optic nerves (central nervous system). It causes the body's disease-fighting (immune) system to destroy the protective covering (myelin sheath) around nerves in the brain. When this happens, signals (nerve impulses) going to and from the brain and spinal cord do not get sent properly or may not get sent at all. There are several types of MS:  Relapsing-remitting MS. This is the most common type. This causes sudden attacks of symptoms. After an attack, you may recover completely until the next attack, or some symptoms may remain permanently.  Secondary progressive MS. This usually develops after the onset of relapsing-remitting MS. Similar to relapsing-remitting MS, this type also causes sudden attacks of symptoms. Attacks may be less frequent, but symptoms slowly get worse (progress) over time.  Primary progressive MS. This causes symptoms that steadily progress over time. This type of MS does not cause sudden attacks of symptoms. The age of onset of MS varies, but it often develops between 57-60 years of age. MS is a lifelong (chronic) condition. There is no cure, but treatment can help slow down the progression of the disease. What are the causes? The cause of this condition is not known. What increases the risk? You are more likely to develop this condition if:  You are a woman.  You have a relative with MS. However, the condition is not passed from parent to child (inherited).  You have a lack (deficiency) of vitamin D.  You smoke. MS is more common in the [place] than in the [place]. What are the signs or symptoms? Relapsing-remitting and secondary progressive MS cause symptoms to occur in episodes or attacks that may last weeks to  months. There may be long periods between attacks in which there are almost no symptoms. Primary progressive MS causes symptoms to steadily progress after they develop. Symptoms of MS vary because of the many different ways it affects the central nervous system. The main symptoms include:  Vision problems and eye pain.  Numbness.  Weakness.  Inability to move your arms, hands, feet, or legs (paralysis).  Balance problems.  Shaking that you cannot control (tremors).  Muscle spasms.  Problems with thinking (cognitive changes). MS can also cause symptoms that are associated with the disease, but are not always the direct result of an MS attack. They may include:  Inability to control urination or bowel movements (incontinence).  Headaches.  Fatigue.  Inability to tolerate heat.  Emotional changes.  Depression.  Pain. How is this diagnosed? This condition is diagnosed based on:  Your symptoms.  A neurological exam. This involves checking central nervous system function, such as nerve function, reflexes, and coordination.  MRIs of the brain and spinal cord.  Lab tests, including a lumbar puncture that tests the fluid that surrounds the brain and spinal cord (cerebrospinal fluid).  Tests to measure the electrical activity of the brain in response to stimulation (evoked potentials). How is this treated? There is no cure for MS, but medicines can help decrease the number and frequency of attacks and help relieve nuisance symptoms. Treatment options may include:  Medicines that reduce the frequency of attacks. These medicines may be given by injection, by mouth (orally), or through an IV.  Medicines that reduce inflammation (steroids). These  may provide short-term relief of symptoms.  Medicines to help control pain, depression, fatigue, or incontinence.  Vitamin D, if you have a deficiency.  Using devices to help you move around (assistive devices), such as braces, a  cane, or a walker.  Physical therapy to strengthen and stretch your muscles.  Occupational therapy to help you with everyday tasks.  Alternative or complementary treatments such as exercise, massage, or acupuncture. Follow these instructions at home:  Take over-the-counter and prescription medicines only as told by your health care provider.  Do not drive or use heavy machinery while taking prescription pain medicine.  Use assistive devices as recommended by your physical therapist or your health care provider.  Exercise as directed by your health care provider.  Return to your normal activities as told by your health care provider. Ask your health care provider what activities are safe for you.  Reach out for support. Share your feelings with friends, family, or a support group.  Keep all follow-up visits as told by your health care provider and therapists. This is important. Where to find more information  National Multiple Sclerosis Society: https://www.nationalmssociety.org Contact a health care provider if:  You feel depressed.  You develop new pain or numbness.  You have tremors.  You have problems with sexual function. Get help right away if:  You develop paralysis.  You develop numbness.  You have problems with your bladder or bowel function.  You develop double vision.  You lose vision in one or both eyes.  You develop suicidal thoughts.  You develop severe confusion. If you ever feel like you may hurt yourself or others, or have thoughts about taking your own life, get help right away. You can go to your nearest emergency department or call:  Your local emergency services (911 in the [place]  A suicide crisis helpline, such as the [place] at 813 145 5117. This is open 24 hours a day. Summary  Multiple sclerosis (MS) is a disease of the central nervous system that causes the body's immune system to destroy the protective  covering (myelin sheath) around nerves in the brain.  There are 3 types of MS: relapsing-remitting, secondary progressive, and primary progressive. Relapsing-remitting and secondary progressive MS cause symptoms to occur in episodes or attacks that may last weeks to months. Primary progressive MS causes symptoms to steadily progress after they develop.  There is no cure for MS, but medicines can help decrease the number and frequency of attacks and help relieve nuisance symptoms. Treatment may also include physical or occupational therapy.  If you develop numbness, paralysis, vision problems, or other neurological symptoms, get help right away. This information is not intended to replace advice given to you by your health care provider. Make sure you discuss any questions you have with your health care provider. Document Revised: 01/14/2017 Document Reviewed: 04/12/2016 Elsevier Patient Education  2020 [employer].

## 2019-08-15 NOTE — Telephone Encounter (Signed)
Took call from phone staff and spoke with Rosey Bath (pharmacy tech). She transferred me to pharmacist and I confirmed that pt is switching to generic glatiramer. Nothing further needed.

## 2019-08-26 ENCOUNTER — Other Ambulatory Visit: Payer: Self-pay | Admitting: Neurology

## 2019-09-11 ENCOUNTER — Other Ambulatory Visit: Payer: Self-pay | Admitting: Neurology

## 2020-01-28 ENCOUNTER — Ambulatory Visit: Payer: BC Managed Care – PPO | Admitting: Neurology

## 2020-01-28 ENCOUNTER — Encounter: Payer: Self-pay | Admitting: Neurology

## 2020-01-28 VITALS — BP 152/82 | HR 120 | Ht 69.0 in | Wt 198.5 lb

## 2020-01-28 DIAGNOSIS — Z298 Encounter for other specified prophylactic measures: Secondary | ICD-10-CM | POA: Diagnosis not present

## 2020-01-28 DIAGNOSIS — M21371 Foot drop, right foot: Secondary | ICD-10-CM

## 2020-01-28 DIAGNOSIS — G4700 Insomnia, unspecified: Secondary | ICD-10-CM

## 2020-01-28 DIAGNOSIS — M2550 Pain in unspecified joint: Secondary | ICD-10-CM

## 2020-01-28 DIAGNOSIS — G35 Multiple sclerosis: Secondary | ICD-10-CM

## 2020-01-28 DIAGNOSIS — R5383 Other fatigue: Secondary | ICD-10-CM

## 2020-01-28 NOTE — Progress Notes (Signed)
[facility]  PATIENT: Lindsay Carter Lindsay Carter DOB: 01-Dec-1970  REFERRING DOCTOR OR PCP:  Milagros Evener [facility])   SOURCE: patient and [facility] records                    _________________________________   HISTORICAL  CHIEF COMPLAINT:  Chief Complaint  Patient presents with   Follow-up    RM 12, alone. Last seen 07/25/19 by AL,NP. On Copaxone for MS.    HISTORY OF PRESENT ILLNESS:  Lindsay Carter is a 49 y.o. woman with relapsing remitting multiple sclerosis.   Update 01/28/20: She is on Copaxone 40 mg two times a week.  She is on generic now and notes no difference.    She is tolerating it well if she takes a Benadryl before each shot.   Due to right leg weakness, her gait is off and unbalanced.  She can walk about 150 to 200 yards without stopping.   Some stumbles but no falls.    She uses the rail on stairs.   Her right side is weak and some spasticity and left is fine.   Occasionally she has numbness in her right leg that is always present.     Bladder function is doing well.    Vision is fine.     She has mild fatigue but feels it is better sinceeels fatigue is tolerable most days but it prevents her form doing errands other days.   Mood is ok.   Cognition is ok unless she is tired.   Insomnia improved with melatonin.   She notes more arthritic pain, especially her knees and hips. Right side is usually worse but her left knee sometimes acts up more  Currently, the left knee is most painful.   She is on etodolac prn.    She has more insomnia f knee hurts more.    She is working from her home now due to Covid-19.   She has had her vaccination and booster.        MS History:   Prior to 2010, she presented with several episodes of numbness and MRI of the spine was reportedly normal (however on my recent review there appear to be 2 thoracic plaques at T4T5 and T6T7).   In 2010, she had an episode of limping and was referred to me.   MRI of the spine  showed one focus and MRI of the brain showed multiple foci consistent with t multiple sclerosis.  There was one enhancing plaque in the left posterior frontal lobe.   She was started on Copaxone and noted mild lipoatrophy. Of note, she is HLA DR 1501 positive. She was clinically stable, she initially went to Copaxone 20 mg every other day in 2012 and then switched to 40 mg 3 times a week in 2014. In 2015 she switch to 40 mg twice a week and has remained relapse free.   Imaging review:  MRI of the brain 09/27/2018 shows T2/FLAIR hyperintense foci in the hemispheres in a pattern and configuration consistent with chronic demyelinating plaque associated with multiple sclerosis. None of the foci appeared to be acute. When compared to the MRI dated 08/11/2016, there was no interval change.   There is a normal enhancement pattern. There are no acute findings.  MRI of the cervical spine 08/11/2016 shows a focus within the right anterior spinal cord adjacent to C4 consistent with a chronic demyelinating plaque associated with multiple sclerosis    At C2-C3, there  is left facet hypertrophy social with a large spur that contacts the thecal sac without causing spinal cord compression. There is no nerve root compression.    At C5-C6, there is a left paramedian disc protrusion causing mild left foraminal narrowing but no nerve root compression.    There is a normal enhancement pattern.   REVIEW OF SYSTEMS: Constitutional: No fevers, chills, sweats, or change in appetite.   Has fatigue Eyes: No visual changes, double vision, eye pain Ear, nose and throat: No hearing loss, ear pain, nasal congestion, sore throat Cardiovascular: No chest pain, palpitations Respiratory: No shortness of breath at rest or with exertion.   No wheezes GastrointestinaI: No nausea, vomiting, diarrhea, abdominal pain, fecal incontinence Genitourinary: No dysuria, urinary retention.  Minimal frequency and minimal stress incontinence.  No  nocturia. Musculoskeletal: No neck pain.  Mild Lower back pain.  Has R and L knee pain, right hip pain.  Integumentary: No rash, pruritus, skin lesions Neurological: as above Psychiatric: No depression at this time.  No anxiety Endocrine: No palpitations, diaphoresis, change in appetite, change in weigh or increased thirst Hematologic/Lymphatic: No anemia, purpura, petechiae. Allergic/Immunologic: No itchy/runny eyes, nasal congestion, recent allergic reactions, rashes  ALLERGIES: Allergies  Allergen Reactions   Erythromycin Nausea And Vomiting    HOME MEDICATIONS:  Current Outpatient Medications:    acetaminophen (TYLENOL) 500 MG tablet, Take 1,000 mg by mouth every 6 (six) hours as needed., Disp: , Rfl:    cetirizine (ZYRTEC) 10 MG tablet, Take 10 mg by mouth daily., Disp: , Rfl: 4   cholecalciferol (VITAMIN D) 1000 UNITS tablet, Take 2,000 Units by mouth daily., Disp: , Rfl:    COPAXONE 40 MG/ML SOSY, INJECT 40 MG UNDER THE SKIN 2 TO 3 TIMES A WEEK, Disp: 36 mL, Rfl: 3   Cranberry 125 MG TABS, Take 125 mg by mouth daily., Disp: , Rfl:    etodolac (LODINE) 400 MG tablet, TAKE 1 TABLET BY MOUTH TWICE A DAY, Disp: 180 tablet, Rfl: 1   fluticasone (FLONASE) 50 MCG/ACT nasal spray, , Disp: , Rfl: 2   hyoscyamine (ANASPAZ) 0.125 MG TBDP disintergrating tablet, Place 0.125 mg under the tongue as needed. , Disp: , Rfl:    JUNEL 1/20 1-20 MG-MCG tablet, Take 1 tablet by mouth daily., Disp: , Rfl: 11   loratadine-pseudoephedrine (CLARITIN-D 12-HOUR) 5-120 MG tablet, Take 1 tablet by mouth at bedtime as needed for allergies., Disp: , Rfl:    Melatonin 3 MG CAPS, Take 3 mg by mouth at bedtime., Disp: , Rfl:    Minoxidil (ROGAINE WOMENS) 5 % FOAM, Apply topically., Disp: , Rfl:    Multiple Vitamin (MULTIVITAMIN) tablet, Take 1 tablet by mouth daily., Disp: , Rfl:    polyethylene glycol (MIRALAX / GLYCOLAX) packet, Take 17 g by mouth daily., Disp: , Rfl:    Rutin 50 MG TABS,  Take 50 tablets by mouth daily. , Disp: , Rfl:    simvastatin (ZOCOR) 10 MG tablet, Take 10 mg by mouth every evening., Disp: , Rfl: 1   vitamin C (ASCORBIC ACID) 500 MG tablet, Take 1,000 mg by mouth daily. Takes gummy, not tablet, Disp: , Rfl:   PAST MEDICAL HISTORY: Past Medical History:  Diagnosis Date   Headache    Multiple sclerosis ([place])    Vision abnormalities     PAST SURGICAL HISTORY: Past Surgical History:  Procedure Laterality Date   KNEE ARTHROSCOPY Left    2006    FAMILY HISTORY: Family History  Problem Relation Age  of Onset   Hypertension Mother    Ataxia Father    CAD Father    Parkinson's disease Father    Bladder Cancer Father    Breast cancer Paternal Aunt 68   Breast cancer Maternal Grandmother        post menopause     SOCIAL HISTORY:  Social History   Socioeconomic History   Marital status: Married    Spouse name: Not on file   Number of children: Not on file   Years of education: Not on file   Highest education level: Not on file  Occupational History   Occupation: Glass blower/designer  Tobacco Use   Smoking status: Never Smoker   Smokeless tobacco: Never Used  Scientific laboratory technician Use: Never used  Substance and Sexual Activity   Alcohol use: Yes    Alcohol/week: 0.0 standard drinks    Comment: 2-3 beers per week/fim   Drug use: No   Sexual activity: Not on file  Other Topics Concern   Not on file  Social History Narrative   Right handed    Caffeine use: 1/2 cup coffee per day   Social Determinants of Health   Financial Resource Strain: Not on file  Food Insecurity: Not on file  Transportation Needs: Not on file  Physical Activity: Not on file  Stress: Not on file  Social Connections: Not on file  Intimate Partner Violence: Not on file     PHYSICAL EXAM  Vitals:   01/28/20 0804  BP: (!) 152/82  Pulse: (!) 120  SpO2: 98%  Weight: 198 lb 8 oz (90 kg)  Height: _0  (1.753 m)    Body mass index  is 29.31 kg/m.   General: The patient is well-developed and well-nourished and in no acute distress .  Mild tenderness and swelling left knee.  Ok ROM   Neurologic Exam  Mental status: The patient is alert and oriented x 3 at the time of the examination. The patient has apparent normal recent and remote memory, with an apparently normal attention span and concentration ability.   Speech is normal.  Cranial nerves: Extraocular movements are full.   Facial strength and sensation is normal.  Trapezius strength is normal.  No dysarthria is noted.  No obvious hearing deficits are noted.                                                                                                                                                                                        Motor:  Muscle bulk is normal.  She has increased muscle tone in the legs, right greater than left.   Strength  is 5/5 in the arms and the left leg. Strength is 4+/5 at the right ankle and toes.   Sensory:  There was intact sensation to touch and vibration in the arms and legs.      Coordination: She has good finger-nose-finger bilaterally.  Heel-to-shin is performed well on the left but reduced on the right.  Gait and station: Station is normal.   There is a right foot drop..  Gait is mildly wide.   Tandem gait is wide and  Romberg is negative.  Reflexes: Deep tendon reflexes are increased in both legs.  There is spread at the knees and nonsustained clonus at the right ankle.        Relapsing remitting multiple sclerosis (HCC)  Multiple joint pain  Other fatigue  Right foot drop  Insomnia, unspecified type   1.   Continue Copaxone twice weekly.  Around time of next visit, will check MRI.  Check anti-SARS-CoV-2 IgG 2.   Ankle x-ray on the left.  Due to multiple joint pain will check labs.  3.   rtc 6 months or sooner if new or worsening neurologic symptoms.      Marquavis Hannen A. Felecia Shelling, MD, PhD 02/58/5277, 8:24 AM Certified  in Neurology, Clinical Neurophysiology, Sleep Medicine, Pain Medicine and Neuroimaging  [facility] [address], [place], [place] [address] [phone]

## 2020-01-29 LAB — RHEUMATOID FACTOR: Rhuematoid fact SerPl-aCnc: <10 IU/mL (ref <14.0)

## 2020-01-29 LAB — ANA W/REFLEX: Anti Nuclear Antibody (ANA): NEGATIVE

## 2020-01-29 LAB — SAR COV2 SEROLOGY (COVID19)AB(IGG),IA
SARS-CoV-2 Semi-Quant IgG Ab: >800 AU/mL (ref <13.0)
SARS-CoV-2 Spike Ab Interp: POSITIVE

## 2020-01-29 LAB — URIC ACID: Uric Acid: 3.9 mg/dL (ref 2.6–6.2)

## 2020-01-30 ENCOUNTER — Telehealth: Payer: Self-pay | Admitting: *Deleted

## 2020-01-30 DIAGNOSIS — Z0000 Encounter for general adult medical examination without abnormal findings: Secondary | ICD-10-CM | POA: Diagnosis not present

## 2020-01-30 DIAGNOSIS — G35 Multiple sclerosis: Secondary | ICD-10-CM | POA: Diagnosis not present

## 2020-01-30 DIAGNOSIS — R030 Elevated blood-pressure reading, without diagnosis of hypertension: Secondary | ICD-10-CM | POA: Diagnosis not present

## 2020-01-30 DIAGNOSIS — E7800 Pure hypercholesterolemia, unspecified: Secondary | ICD-10-CM | POA: Diagnosis not present

## 2020-01-30 DIAGNOSIS — Z1211 Encounter for screening for malignant neoplasm of colon: Secondary | ICD-10-CM | POA: Diagnosis not present

## 2020-01-30 NOTE — Telephone Encounter (Signed)
-----   Message from Asa Lente, MD sent at 01/29/2020  5:11 PM EST ----- Please let her know that the labs for arthritis were normal.  The antibody test looks good and shows that you have had a good immune response against COVID-19

## 2020-01-30 NOTE — Telephone Encounter (Signed)
Called and spoke with pt about results per Dr. Sater note. Pt verbalized understanding. 

## 2020-02-14 ENCOUNTER — Ambulatory Visit
Admission: RE | Admit: 2020-02-14 | Discharge: 2020-02-14 | Disposition: A | Discharge status: Home / Self Care | Payer: BC Managed Care – PPO | Source: Ambulatory Visit | Attending: Neurology | Admitting: Neurology

## 2020-02-14 DIAGNOSIS — M25462 Effusion, left knee: Secondary | ICD-10-CM | POA: Diagnosis not present

## 2020-02-14 DIAGNOSIS — M7989 Other specified soft tissue disorders: Secondary | ICD-10-CM | POA: Diagnosis not present

## 2020-02-14 DIAGNOSIS — M2550 Pain in unspecified joint: Secondary | ICD-10-CM

## 2020-02-14 DIAGNOSIS — M1712 Unilateral primary osteoarthritis, left knee: Secondary | ICD-10-CM | POA: Diagnosis not present

## 2020-02-18 ENCOUNTER — Telehealth: Payer: Self-pay | Admitting: *Deleted

## 2020-02-18 NOTE — Telephone Encounter (Signed)
Called and spoke with pt about results per Dr. Sater note. She verbalized understanding.  

## 2020-02-18 NOTE — Telephone Encounter (Signed)
-----   Message from Asa Lente, MD sent at 02/17/2020  4:17 PM EST ----- Please let her know that the x-ray of the left knee showed mild degenerative changes.  If pain continues to worsen she should see orthopedics.

## 2020-02-29 DIAGNOSIS — L718 Other rosacea: Secondary | ICD-10-CM | POA: Diagnosis not present

## 2020-02-29 DIAGNOSIS — L720 Epidermal cyst: Secondary | ICD-10-CM | POA: Diagnosis not present

## 2020-02-29 DIAGNOSIS — L918 Other hypertrophic disorders of the skin: Secondary | ICD-10-CM | POA: Diagnosis not present

## 2020-03-13 DIAGNOSIS — Z1211 Encounter for screening for malignant neoplasm of colon: Secondary | ICD-10-CM | POA: Diagnosis not present

## 2020-03-13 DIAGNOSIS — Z1212 Encounter for screening for malignant neoplasm of rectum: Secondary | ICD-10-CM | POA: Diagnosis not present

## 2020-03-21 LAB — COLOGUARD: COLOGUARD: NEGATIVE

## 2020-03-27 ENCOUNTER — Telehealth: Payer: Self-pay | Admitting: Neurology

## 2020-03-27 DIAGNOSIS — G35 Multiple sclerosis: Secondary | ICD-10-CM

## 2020-03-27 MED ORDER — GLATIRAMER ACETATE 40 MG/ML ~~LOC~~ SOSY: PREFILLED_SYRINGE | SUBCUTANEOUS | 3 refills | Status: DC

## 2020-03-27 NOTE — Telephone Encounter (Signed)
E-scribed refills.  

## 2020-03-27 NOTE — Telephone Encounter (Signed)
Pt request refill for Glatiramer at [facility] [place].  Pt said make sure prescription is for Glatiramer, she cannot afford Copaxone.

## 2020-03-28 NOTE — Telephone Encounter (Signed)
Pt has called to inform Emma,RN that she checked with the pharmacy this morning re: her Glatiramer Acetate (COPAXONE) 40 MG/ML SOSY and they have not received anything as of yet.

## 2020-03-31 NOTE — Telephone Encounter (Signed)
Called [facility] and spoke with Lanora Manis. Verified they received rx e-scribed last week. I confirmed directions. Nothing further needed.

## 2020-04-24 ENCOUNTER — Telehealth: Payer: Self-pay | Admitting: Neurology

## 2020-04-24 MED ORDER — ETODOLAC 400 MG PO TABS: 400.0000 mg | ORAL_TABLET | Freq: Two times a day (BID) | ORAL | 1 refills | Status: DC

## 2020-04-24 NOTE — Telephone Encounter (Signed)
E-scribed refill as requested. 

## 2020-04-24 NOTE — Addendum Note (Signed)
Addended by: Arther Abbott on: 04/24/2020 10:31 AM   Modules accepted: Orders

## 2020-04-24 NOTE — Telephone Encounter (Signed)
Pt is asking for a refill on her etodolac (LODINE) 400 MG tablet to [facility] [phone]

## 2020-05-12 DIAGNOSIS — J3089 Other allergic rhinitis: Secondary | ICD-10-CM | POA: Diagnosis not present

## 2020-05-12 DIAGNOSIS — R062 Wheezing: Secondary | ICD-10-CM | POA: Diagnosis not present

## 2020-07-25 ENCOUNTER — Encounter: Payer: Self-pay | Admitting: Neurology

## 2020-07-25 ENCOUNTER — Ambulatory Visit: Payer: BC Managed Care – PPO | Admitting: Neurology

## 2020-07-25 VITALS — Ht 68.5 in | Wt 202.0 lb

## 2020-07-25 DIAGNOSIS — G35 Multiple sclerosis: Secondary | ICD-10-CM | POA: Diagnosis not present

## 2020-07-25 DIAGNOSIS — R5383 Other fatigue: Secondary | ICD-10-CM

## 2020-07-25 DIAGNOSIS — M21371 Foot drop, right foot: Secondary | ICD-10-CM | POA: Diagnosis not present

## 2020-07-25 DIAGNOSIS — M2550 Pain in unspecified joint: Secondary | ICD-10-CM

## 2020-07-25 NOTE — Progress Notes (Signed)
[facility]  PATIENT: Lindsay Carter DOB: 08-22-1970  REFERRING DOCTOR OR PCP:  Milagros Evener [facility])   SOURCE: patient and [facility] records                    _________________________________   HISTORICAL  CHIEF COMPLAINT:  Chief Complaint  Patient presents with   Follow-up    RM 12, alone. Doing well on glatiramer.     HISTORY OF PRESENT ILLNESS:  Mera Gunkel is a 50 y.o. woman with relapsing remitting multiple sclerosis.   Update 01/28/20: She is on glatiramer 40 mg two times a week.    She is tolerating it well if she takes a Benadryl before each shot.     She has right leg weakness, her gait is off and unbalanced.  She can walk about 150 to 200 yards without stopping. And close to a mile without a sitting rest   Some stumbles but no falls.    She uses the rail on stair.  Her right hand is fine.  .   Her right leg has spasticity and left is fine.   She does not need anti-spasticity medication.   She has numbness in her right leg that is always present.     Bladder function is doing well.    Vision is fine.     She has mild fatigue, worse with heat.  Mood is ok.   Cognition is ok unless she is tired.   Insomnia improved with melatonin.   Her joint pain has done better since the last visit - less knee/ankle pain.    She is on etodolac prn.     She is working home and office now due to Covid-19.   She has had her vaccination and booster.        MS History:   Prior to 2010, she presented with several episodes of numbness and MRI of the spine was reportedly normal (however on my recent review there appear to be 2 thoracic plaques at T4T5 and T6T7).   In 2010, she had an episode of limping and was referred to me.   MRI of the spine showed one focus and MRI of the brain showed multiple foci consistent with t multiple sclerosis.  There was one enhancing plaque in the left posterior frontal lobe.   She was started on Copaxone and noted  mild lipoatrophy. Of note, she is HLA DR 1501 positive. She was clinically stable, she initially went to Copaxone 20 mg every other day in 2012 and then switched to 40 mg 3 times a week in 2014. In 2015 she switch to 40 mg twice a week and has remained relapse free.   Imaging review:  MRI of the brain 09/27/2018 shows T2/FLAIR hyperintense foci in the hemispheres in a pattern and configuration consistent with chronic demyelinating plaque associated with multiple sclerosis. None of the foci appeared to be acute. When compared to the MRI dated 08/11/2016, there was no interval change.   There is a normal enhancement pattern. There are no acute findings.  MRI of the cervical spine 08/11/2016 shows a focus within the right anterior spinal cord adjacent to C4 consistent with a chronic demyelinating plaque associated with multiple sclerosis    At C2-C3, there is left facet hypertrophy social with a large spur that contacts the thecal sac without causing spinal cord compression. There is no nerve root compression.    At C5-C6, there is a left paramedian  disc protrusion causing mild left foraminal narrowing but no nerve root compression.    There is a normal enhancement pattern.   REVIEW OF SYSTEMS: Constitutional: No fevers, chills, sweats, or change in appetite.   Has fatigue Eyes: No visual changes, double vision, eye pain Ear, nose and throat: No hearing loss, ear pain, nasal congestion, sore throat Cardiovascular: No chest pain, palpitations Respiratory:  No shortness of breath at rest or with exertion.   No wheezes GastrointestinaI: No nausea, vomiting, diarrhea, abdominal pain, fecal incontinence Genitourinary:  No dysuria, urinary retention.  Minimal frequency and minimal stress incontinence.  No nocturia. Musculoskeletal:  No neck pain.  Mild Lower back pain.  Has R and L knee pain, right hip pain.  Integumentary: No rash, pruritus, skin lesions Neurological: as above Psychiatric: No depression at  this time.  No anxiety Endocrine: No palpitations, diaphoresis, change in appetite, change in weigh or increased thirst Hematologic/Lymphatic:  No anemia, purpura, petechiae. Allergic/Immunologic: No itchy/runny eyes, nasal congestion, recent allergic reactions, rashes  ALLERGIES: Allergies  Allergen Reactions   Erythromycin Nausea And Vomiting    HOME MEDICATIONS:  Current Outpatient Medications:    acetaminophen (TYLENOL) 500 MG tablet, Take 1,000 mg by mouth every 6 (six) hours as needed., Disp: , Rfl:    cetirizine (ZYRTEC) 10 MG tablet, Take 10 mg by mouth daily., Disp: , Rfl: 4   cholecalciferol (VITAMIN D) 1000 UNITS tablet, Take 2,000 Units by mouth daily., Disp: , Rfl:    Cranberry 125 MG TABS, Take 125 mg by mouth daily., Disp: , Rfl:    etodolac (LODINE) 400 MG tablet, Take 1 tablet (400 mg total) by mouth 2 (two) times daily., Disp: 180 tablet, Rfl: 1   fluticasone (FLONASE) 50 MCG/ACT nasal spray, , Disp: , Rfl: 2   Glatiramer Acetate (COPAXONE) 40 MG/ML SOSY, INJECT 40 MG UNDER THE SKIN 2 TO 3 TIMES A WEEK, Disp: 36 mL, Rfl: 3   hyoscyamine (ANASPAZ) 0.125 MG TBDP disintergrating tablet, Place 0.125 mg under the tongue as needed. , Disp: , Rfl:    JUNEL 1/20 1-20 MG-MCG tablet, Take 1 tablet by mouth daily., Disp: , Rfl: 11   loratadine-pseudoephedrine (CLARITIN-D 12-HOUR) 5-120 MG tablet, Take 1 tablet by mouth at bedtime as needed for allergies., Disp: , Rfl:    Melatonin 3 MG CAPS, Take 3 mg by mouth at bedtime., Disp: , Rfl:    Minoxidil 5 % FOAM, Apply topically., Disp: , Rfl:    Multiple Vitamin (MULTIVITAMIN) tablet, Take 1 tablet by mouth daily., Disp: , Rfl:    polyethylene glycol (MIRALAX / GLYCOLAX) packet, Take 17 g by mouth daily., Disp: , Rfl:    Rutin 50 MG TABS, Take 50 tablets by mouth daily. , Disp: , Rfl:    simvastatin (ZOCOR) 10 MG tablet, Take 10 mg by mouth every evening., Disp: , Rfl: 1   vitamin C (ASCORBIC ACID) 500 MG tablet, Take 1,000 mg by  mouth daily. Takes gummy, not tablet, Disp: , Rfl:   PAST MEDICAL HISTORY: Past Medical History:  Diagnosis Date   Headache    Multiple sclerosis ([place])    Vision abnormalities     PAST SURGICAL HISTORY: Past Surgical History:  Procedure Laterality Date   KNEE ARTHROSCOPY Left    2006    FAMILY HISTORY: Family History  Problem Relation Age of Onset   Hypertension Mother    Ataxia Father    CAD Father    Parkinson's disease Father    Bladder Cancer  Father    Breast cancer Paternal Aunt 8   Breast cancer Maternal Grandmother        post menopause     SOCIAL HISTORY:  Social History   Socioeconomic History   Marital status: Married    Spouse name: Not on file   Number of children: Not on file   Years of education: Not on file   Highest education level: Not on file  Occupational History   Occupation: Glass blower/designer  Tobacco Use   Smoking status: Never   Smokeless tobacco: Never  Vaping Use   Vaping Use: Never used  Substance and Sexual Activity   Alcohol use: Yes    Alcohol/week: 0.0 standard drinks    Comment: 2-3 beers per week/fim   Drug use: No   Sexual activity: Not on file  Other Topics Concern   Not on file  Social History Narrative   Right handed    Caffeine use: 1/2 cup coffee per day   Social Determinants of Health   Financial Resource Strain: Not on file  Food Insecurity: Not on file  Transportation Needs: Not on file  Physical Activity: Not on file  Stress: Not on file  Social Connections: Not on file  Intimate Partner Violence: Not on file     PHYSICAL EXAM  Vitals:   07/25/20 0811  Weight: 202 lb (91.6 kg)  Height: 5' 8.5" (1.74 m)    Body mass index is 30.27 kg/m.   General: The patient is well-developed and well-nourished and in no acute distress .  Mild tenderness and swelling left knee.  Ok ROM   Neurologic Exam  Mental status: The patient is alert and oriented x 3 at the time of the examination. The patient has  apparent normal recent and remote memory, with an apparently normal attention span and concentration ability.   Speech is normal.  Cranial nerves: Extraocular movements are full.   Facial strength and sensation is normal.  Trapezius strength is normal.  No dysarthria is noted.  No obvious hearing deficits are noted.                                                                                                                                                                                        Motor:  Muscle bulk is normal.  She has increased muscle tone in the legs, right greater than left.   Strength is 5/5 in the arms and the left leg. Strength is 4-/5 riht hip flexor, 4/5 at the right ankle and toes. 4+/5 quads/hamstrings.   Sensory:  There was intact sensation to touch and vibration in the arms and slight asymmetry  of vibration sensation in legs with normal temp sensation    Coordination: She has good finger-nose-finger bilaterally.  Heel-to-shin is performed well on the left but reduced on the right.  Gait and station: Station is normal.   There is a right foot drop..  Gait is mildly wide.   Tandem gait is wide and  Romberg is negative.  Reflexes: Deep tendon reflexes are increased in both legs.  There is spread at the knees and nonsustained clonus at the right ankle.        No diagnosis found.   1.   Continue Copaxone twice weekly.  Around time of next visit, will check brain MRI (prefers to do after November)    2.   Stay active and exercise.  3.   rtc 6 months or sooner if new or worsening neurologic symptoms.      Ryin Ambrosius A. Felecia Shelling, MD, PhD 10/17/1113, 5:20 AM Certified in Neurology, Clinical Neurophysiology, Sleep Medicine, Pain Medicine and Neuroimaging  [facility] [address] [place],  [address] [phone]

## 2020-07-30 ENCOUNTER — Ambulatory Visit: Payer: BC Managed Care – PPO | Admitting: Neurology

## 2020-07-31 DIAGNOSIS — E7800 Pure hypercholesterolemia, unspecified: Secondary | ICD-10-CM | POA: Diagnosis not present

## 2020-10-20 ENCOUNTER — Other Ambulatory Visit: Payer: Self-pay | Admitting: Neurology

## 2021-01-22 NOTE — Progress Notes (Signed)
PATIENT: Lindsay Carter DOB: 1970/05/17  REASON FOR VISIT: follow up HISTORY FROM: patient  Chief Complaint  Patient presents with   Follow-up    RM 1, alone. Doing well on glatiramer twice weekly.    HISTORY OF PRESENT ILLNESS: 01/26/21 ALL:  Lindsay Carter is a 50 y.o. female here today for follow up for RRMS. She continues Capaxone injections. MRI stable in 09/2018.  She feels that she is doing well. She has good days and bad days. She does continue to have tingling of right leg. Numbness, mostly, but occasional pain. Seems to be worse at night. No gait changes. No new or worsening numbness or weakness.   She does take etodolac 422m BID for generalized arthritic pain. She feels that this helps. CHEM is checked biannually with PCP and normal.   She has been vaccinated. She is a oSales promotion account executivefor a [employer] She is working two days in the office and three days from home. She is sleeping well. Mood is good. She does have anxiety. BP is usually elevated in MD office. Typical SBP at home is 130-140.    HISTORY: (copied from Dr SGarth Bignessprevious note)  JSarra Rachelsis a 50y.o. woman with relapsing remitting multiple sclerosis.    Update 01/28/20: She is on glatiramer 40 mg two times a week.    She is tolerating it well if she takes a Benadryl before each shot.      She has right leg weakness, her gait is off and unbalanced.  She can walk about 150 to 200 yards without stopping. And close to a mile without a sitting rest   Some stumbles but no falls.    She uses the rail on stair.  Her right hand is fine.  .   Her right leg has spasticity and left is fine.   She does not need anti-spasticity medication.   She has numbness in her right leg that is always present.     Bladder function is doing well.    Vision is fine.      She has mild fatigue, worse with heat.  Mood is ok.   Cognition is ok unless she is tired.   Insomnia improved with melatonin.    Her joint pain  has done better since the last visit - less knee/ankle pain.    She is on etodolac prn.      She is working home and office now due to Covid-19.   She has had her vaccination and booster.         MS History:   Prior to 2010, she presented with several episodes of numbness and MRI of the spine was reportedly normal (however on my recent review there appear to be 2 thoracic plaques at T4T5 and T6T7).   In 2010, she had an episode of limping and was referred to me.   MRI of the spine showed one focus and MRI of the brain showed multiple foci consistent with t multiple sclerosis.  There was one enhancing plaque in the left posterior frontal lobe.   She was started on Copaxone and noted mild lipoatrophy. Of note, she is HLA DR 1501 positive. She was clinically stable, she initially went to Copaxone 20 mg every other day in 2012 and then switched to 40 mg 3 times a week in 2014. In 2015 she switch to 40 mg twice a week and has remained relapse free.    Imaging review:  MRI of the brain 09/27/2018 shows T2/FLAIR hyperintense foci in the hemispheres in a pattern and configuration consistent with chronic demyelinating plaque associated with multiple sclerosis. None of the foci appeared to be acute. When compared to the MRI dated 08/11/2016, there was no interval change.   There is a normal enhancement pattern. There are no acute findings.   MRI of the cervical spine 08/11/2016 shows a focus within the right anterior spinal cord adjacent to C4 consistent with a chronic demyelinating plaque associated with multiple sclerosis    At C2-C3, there is left facet hypertrophy social with a large spur that contacts the thecal sac without causing spinal cord compression. There is no nerve root compression.    At C5-C6, there is a left paramedian disc protrusion causing mild left foraminal narrowing but no nerve root compression.    There is a normal enhancement pattern.  REVIEW OF SYSTEMS: Out of a complete 14 system review of  symptoms, the patient complains only of the following symptoms, arthritic pain, tingling right leg, and all other reviewed systems are negative.  ALLERGIES: Allergies  Allergen Reactions   Erythromycin Nausea And Vomiting    HOME MEDICATIONS: Outpatient Medications Prior to Visit  Medication Sig Dispense Refill   acetaminophen (TYLENOL) 500 MG tablet Take 1,000 mg by mouth every 6 (six) hours as needed.     cetirizine (ZYRTEC) 10 MG tablet Take 10 mg by mouth daily.  4   cholecalciferol (VITAMIN D) 1000 UNITS tablet Take 2,000 Units by mouth daily.     Cranberry 125 MG TABS Take 125 mg by mouth daily.     etodolac (LODINE) 400 MG tablet TAKE 1 TABLET BY MOUTH TWICE A DAY 180 tablet 1   fluticasone (FLONASE) 50 MCG/ACT nasal spray   2   Glatiramer Acetate (COPAXONE) 40 MG/ML SOSY INJECT 40 MG UNDER THE SKIN 2 TO 3 TIMES A WEEK 36 mL 3   hyoscyamine (ANASPAZ) 0.125 MG TBDP disintergrating tablet Place 0.125 mg under the tongue as needed.      JUNEL 1/20 1-20 MG-MCG tablet Take 1 tablet by mouth daily.  11   loratadine-pseudoephedrine (CLARITIN-D 12-HOUR) 5-120 MG tablet Take 1 tablet by mouth at bedtime as needed for allergies.     Melatonin 3 MG CAPS Take 3 mg by mouth at bedtime.     Minoxidil 5 % FOAM Apply topically.     Multiple Vitamin (MULTIVITAMIN) tablet Take 1 tablet by mouth daily.     polyethylene glycol (MIRALAX / GLYCOLAX) packet Take 17 g by mouth daily.     Rutin 50 MG TABS Take 50 tablets by mouth daily.      simvastatin (ZOCOR) 10 MG tablet Take 10 mg by mouth every evening.  1   vitamin C (ASCORBIC ACID) 500 MG tablet Take 1,000 mg by mouth daily. Takes gummy, not tablet     No facility-administered medications prior to visit.    PAST MEDICAL HISTORY: Past Medical History:  Diagnosis Date   Headache    Multiple sclerosis ([place])    Vision abnormalities     PAST SURGICAL HISTORY: Past Surgical History:  Procedure Laterality Date   KNEE ARTHROSCOPY Left     2006    FAMILY HISTORY: Family History  Problem Relation Age of Onset   Hypertension Mother    Ataxia Father    CAD Father    Parkinson's disease Father    Bladder Cancer Father    Breast cancer Paternal Aunt 39   Breast  cancer Maternal Grandmother        post menopause     SOCIAL HISTORY: Social History   Socioeconomic History   Marital status: Married    Spouse name: Not on file   Number of children: Not on file   Years of education: Not on file   Highest education level: Not on file  Occupational History   Occupation: Glass blower/designer  Tobacco Use   Smoking status: Never   Smokeless tobacco: Never  Vaping Use   Vaping Use: Never used  Substance and Sexual Activity   Alcohol use: Yes    Alcohol/week: 0.0 standard drinks    Comment: 2-3 beers per week/fim   Drug use: No   Sexual activity: Not on file  Other Topics Concern   Not on file  Social History Narrative   Right handed    Caffeine use: 1/2 cup coffee per day   Social Determinants of Health   Financial Resource Strain: Not on file  Food Insecurity: Not on file  Transportation Needs: Not on file  Physical Activity: Not on file  Stress: Not on file  Social Connections: Not on file  Intimate Partner Violence: Not on file      PHYSICAL EXAM  Vitals:   01/26/21 0808 01/26/21 0831  BP: (!) 173/104 (!) 170/100  Pulse: (!) 128   Weight: 206 lb (93.4 kg)   Height: 5' 8.5" (1.74 m)     Body mass index is 30.87 kg/m.  Generalized: Well developed, in no acute distress  Neurological examination  Mentation: Alert oriented to time, place, history taking. Follows all commands speech and language fluent Cranial nerve II-XII: Pupils were equal round reactive to light. Extraocular movements were full, visual field were full on confrontational test. Facial sensation and strength were normal. Uvula tongue midline. Head turning and shoulder shrug  were normal and symmetric. Motor: The motor testing reveals 5  over 5 strength of right upper, left upper and lower extremity, 4+/5 right hip flexion, extension . Good symmetric motor tone is noted throughout.  Sensory: Sensory testing is intact to soft touch on all 4 extremities. No evidence of extinction is noted.  Coordination: Cerebellar testing reveals good finger-nose-finger and heel-to-shin bilaterally.  Gait and station: Gait is slightly wide and spastic, right foot drop noted.   Reflexes: Deep tendon reflexes are symmetric and normal bilaterally.   DIAGNOSTIC DATA (LABS, IMAGING, TESTING) - I reviewed patient records, labs, notes, testing and imaging myself where available.  No flowsheet data found.   No results found for: WBC, HGB, HCT, MCV, PLT No results found for: NA, K, CL, CO2, GLUCOSE, BUN, CREATININE, CALCIUM, PROT, ALBUMIN, AST, ALT, ALKPHOS, BILITOT, GFRNONAA, GFRAA No results found for: CHOL, HDL, LDLCALC, LDLDIRECT, TRIG, CHOLHDL No results found for: HGBA1C No results found for: VITAMINB12 No results found for: TSH     ASSESSMENT AND PLAN 50 y.o. year old female  has a past medical history of Headache, Multiple sclerosis ([place]), and Vision abnormalities. here with     ICD-10-CM   1. Relapsing remitting multiple sclerosis ([place])  G35 MR BRAIN WO CONTRAST    2. Multiple joint pain  M25.50     3. Other fatigue  R53.83     4. Insomnia, unspecified type  G47.00     5. Dysesthesia  R20.8        Kariya is doing well, today.  We will continue Copaxone injections as prescribed.  She will continue etodolac twice daily. She will continue  labs with PCP. No history of kidney disease. We will try a low dose of gabapentin 181m QHS for dysesthesias. She was encouraged to stay as active as possible.  Adequate hydration and well-balanced diet encouraged. She will follow-up with Dr. SFelecia Shellingin 6 months, sooner if needed.  She verbalizes understanding and agreement with this plan.   Orders Placed This Encounter  Procedures   MR BRAIN WO  CONTRAST    Standing Status:   Future    Standing Expiration Date:   01/26/2022    Order Specific Question:   What is the patient's sedation requirement?    Answer:   No Sedation    Order Specific Question:   Does the patient have a pacemaker or implanted devices?    Answer:   No    Order Specific Question:   Preferred imaging location?    Answer:   Internal      Meds ordered this encounter  Medications   gabapentin (NEURONTIN) 100 MG capsule    Sig: Take 1 capsule (100 mg total) by mouth at bedtime.    Dispense:  90 capsule    Refill:  3    Order Specific Question:   Supervising Provider    Answer:   AMelvenia Beam[[[id number]        VGJ FTNBZ FNP-C 01/26/2021, 8:34 AM [facility] [address] [place] [phone]

## 2021-01-22 NOTE — Patient Instructions (Signed)
Below is our plan:  We will continue Capaxone injections. Continue Etodolac 400mg  twice daily. Please continue checking kidney function with PCP and let me know of any abnormalities. We will start a low dose of gabapentin to help with numbness/tingling of right leg at night. Take 100mg  daily at bedtime. Let me know if you have any questions or concerns after starting medications. Please keep an eye on your blood pressure at home. Call PCP if readings do not return to baseline when you get home.   Please make sure you are staying well hydrated. I recommend 50-60 ounces daily. Well balanced diet and regular exercise encouraged. Consistent sleep schedule with 6-8 hours recommended.   Please continue follow up with care team as directed.   Follow up with Dr in 6 months  You may receive a survey regarding today's visit. I encourage you to leave honest feed back as I do use this information to improve patient care. Thank you for seeing me today!

## 2021-01-26 ENCOUNTER — Ambulatory Visit: Payer: BC Managed Care – PPO | Admitting: Family Medicine

## 2021-01-26 ENCOUNTER — Encounter: Payer: Self-pay | Admitting: Family Medicine

## 2021-01-26 VITALS — BP 170/100 | HR 128 | Ht 68.5 in | Wt 206.0 lb

## 2021-01-26 DIAGNOSIS — R5383 Other fatigue: Secondary | ICD-10-CM | POA: Diagnosis not present

## 2021-01-26 DIAGNOSIS — G35 Multiple sclerosis: Secondary | ICD-10-CM | POA: Diagnosis not present

## 2021-01-26 DIAGNOSIS — R208 Other disturbances of skin sensation: Secondary | ICD-10-CM

## 2021-01-26 DIAGNOSIS — M2550 Pain in unspecified joint: Secondary | ICD-10-CM | POA: Diagnosis not present

## 2021-01-26 DIAGNOSIS — G4700 Insomnia, unspecified: Secondary | ICD-10-CM

## 2021-01-26 MED ORDER — GABAPENTIN 100 MG PO CAPS: 100.0000 mg | ORAL_CAPSULE | Freq: Every day | ORAL | 3 refills | Status: DC

## 2021-01-28 ENCOUNTER — Telehealth: Payer: Self-pay | Admitting: Family Medicine

## 2021-01-28 NOTE — Telephone Encounter (Signed)
Patient is scheduled at [facility] for 02/25/21.

## 2021-02-23 ENCOUNTER — Other Ambulatory Visit: Payer: Self-pay | Admitting: Family Medicine

## 2021-02-23 DIAGNOSIS — Z1231 Encounter for screening mammogram for malignant neoplasm of breast: Secondary | ICD-10-CM

## 2021-02-25 ENCOUNTER — Other Ambulatory Visit: Payer: BC Managed Care – PPO

## 2021-03-02 NOTE — Telephone Encounter (Signed)
[employer] Berkley Harvey: [id number] (exp. 03/02/21 to 03/31/21) patient is scheduled at [facility] for 03/04/21.

## 2021-03-04 ENCOUNTER — Ambulatory Visit: Payer: BC Managed Care – PPO

## 2021-03-04 DIAGNOSIS — G35 Multiple sclerosis: Secondary | ICD-10-CM

## 2021-03-05 ENCOUNTER — Telehealth: Payer: Self-pay | Admitting: Neurology

## 2021-03-05 DIAGNOSIS — D2262 Melanocytic nevi of left upper limb, including shoulder: Secondary | ICD-10-CM | POA: Diagnosis not present

## 2021-03-05 DIAGNOSIS — L718 Other rosacea: Secondary | ICD-10-CM | POA: Diagnosis not present

## 2021-03-05 DIAGNOSIS — D2271 Melanocytic nevi of right lower limb, including hip: Secondary | ICD-10-CM | POA: Diagnosis not present

## 2021-03-05 DIAGNOSIS — D2261 Melanocytic nevi of right upper limb, including shoulder: Secondary | ICD-10-CM | POA: Diagnosis not present

## 2021-03-05 NOTE — Telephone Encounter (Signed)
Called the patient and reviewed the MRI brain results with her. Pt verbalized understanding. Pt had no questions at this time but was encouraged to call back if questions arise. Pt was appreciative for the call

## 2021-03-05 NOTE — Telephone Encounter (Signed)
-----   Message from Debbora Presto, NP sent at 03/05/2021  7:15 AM EST ----- Please let her know that her MRI was stable and did not show any new lesions!!! We will continue current treatment plan.

## 2021-03-11 DIAGNOSIS — E7800 Pure hypercholesterolemia, unspecified: Secondary | ICD-10-CM | POA: Diagnosis not present

## 2021-03-11 DIAGNOSIS — R208 Other disturbances of skin sensation: Secondary | ICD-10-CM | POA: Diagnosis not present

## 2021-03-11 DIAGNOSIS — K589 Irritable bowel syndrome without diarrhea: Secondary | ICD-10-CM | POA: Diagnosis not present

## 2021-03-11 DIAGNOSIS — Z0000 Encounter for general adult medical examination without abnormal findings: Secondary | ICD-10-CM | POA: Diagnosis not present

## 2021-03-11 DIAGNOSIS — G35 Multiple sclerosis: Secondary | ICD-10-CM | POA: Diagnosis not present

## 2021-03-16 ENCOUNTER — Other Ambulatory Visit: Payer: Self-pay | Admitting: Neurology

## 2021-03-16 DIAGNOSIS — G35 Multiple sclerosis: Secondary | ICD-10-CM

## 2021-03-24 ENCOUNTER — Ambulatory Visit
Admission: RE | Admit: 2021-03-24 | Discharge: 2021-03-24 | Disposition: A | Discharge status: Home / Self Care | Payer: BC Managed Care – PPO | Source: Ambulatory Visit | Attending: Family Medicine | Admitting: Family Medicine

## 2021-03-24 DIAGNOSIS — Z1231 Encounter for screening mammogram for malignant neoplasm of breast: Secondary | ICD-10-CM

## 2021-04-14 ENCOUNTER — Other Ambulatory Visit: Payer: Self-pay | Admitting: Neurology

## 2021-04-24 ENCOUNTER — Telehealth: Payer: Self-pay | Admitting: Family Medicine

## 2021-04-24 NOTE — Telephone Encounter (Signed)
Pt called requesting that the RN give her a call to discuss her medications. Pt would not give anymore details. Please advise. ?

## 2021-04-27 ENCOUNTER — Other Ambulatory Visit: Payer: Self-pay | Admitting: Neurology

## 2021-04-27 DIAGNOSIS — G35 Multiple sclerosis: Secondary | ICD-10-CM

## 2021-04-27 MED ORDER — GABAPENTIN 100 MG PO CAPS: 100.0000 mg | ORAL_CAPSULE | Freq: Three times a day (TID) | ORAL | 3 refills | Status: DC

## 2021-04-27 MED ORDER — GLATIRAMER ACETATE 40 MG/ML ~~LOC~~ SOSY: 40.0000 mg | PREFILLED_SYRINGE | SUBCUTANEOUS | 1 refills | Status: DC

## 2021-04-27 NOTE — Telephone Encounter (Signed)
Called the pt back to ask her questions.  ?Pt states Amy wanted her to call in an update on the Gabapentin and its effectiveness. She states that for the first month she did well but it has slowly became more noticeable that the leg cramps are getting worse at night. She wanted to know if Amy would like to increase dose.  ? ?She also received the covid booster in Sept 2022 and she wanted to know if she needed to get another booster before the fall or if she should wait and get in the fall next year?  ? ?Pt takes generic copaxone two times a week but because the script stated 2-3 times a week and was written for amount 36 ml in 3 months, the pharmacy is sending her too much and so they keep putting a pause on med. She asked that script be corrected to reflect that she is only taking the med 2 times a week. Med corrected and sent to the speciality pharmacy for the pt.  ? ?Advised I would call back with the answers to the other two questions Pt verbalized understanding. ? ?

## 2021-04-27 NOTE — Telephone Encounter (Signed)
Called the pt back and advised that Amy recommends moving up to TID with the gabapentin. She states that she can try separating the doses to see what best fits her. Her recommendation would be to start with 1 capsule at dinner and then take 3 at bedtime. Advised that she can find what dosing works better for her.  ?Pt verbalized understanding. ?A new script has been sent to the pharmacy for the pt.  ?Advised that they have been recommending the MD patents continue to get the boosters every 6 mths. Pt verbalized understanding. ?Pt had no questions at this time but was encouraged to call back if questions arise. ? ?

## 2021-05-12 DIAGNOSIS — J3089 Other allergic rhinitis: Secondary | ICD-10-CM | POA: Diagnosis not present

## 2021-05-12 DIAGNOSIS — R062 Wheezing: Secondary | ICD-10-CM | POA: Diagnosis not present

## 2021-07-01 ENCOUNTER — Telehealth: Payer: Self-pay | Admitting: Family Medicine

## 2021-07-01 NOTE — Telephone Encounter (Signed)
Called patient and she states that she has normal back pain d/t arthritis. This am was different , she was unable to move like normal d/t the pain. She states she is unable to stand straight without feeling like she is getting karate chopped in lower back. She states that difficulty sitting though as well so nothing seems to be comfortable. She has not done anything different or caused any injury. There is nothing else that would lead to think exacerbation is what is the cause. She has tried etodolac, extra strength tylenol, hot/cold compress, rubbing cream and nothing has helped provide relief. I was able to get her in with Dr Epimenio Foot for a work in visit for tomorrow.  ?

## 2021-07-01 NOTE — Telephone Encounter (Signed)
Pt called stating that she thinks she is having an exacerbation. She has a lot of back pain and is not able to sit nor stand without being in pain and does not know what to do. Please advise. ?

## 2021-07-02 ENCOUNTER — Ambulatory Visit: Payer: BC Managed Care – PPO | Admitting: Neurology

## 2021-07-02 ENCOUNTER — Encounter: Payer: Self-pay | Admitting: Neurology

## 2021-07-02 VITALS — BP 168/97 | HR 126 | Ht 68.5 in | Wt 203.0 lb

## 2021-07-02 DIAGNOSIS — M2550 Pain in unspecified joint: Secondary | ICD-10-CM | POA: Diagnosis not present

## 2021-07-02 DIAGNOSIS — G35 Multiple sclerosis: Secondary | ICD-10-CM

## 2021-07-02 DIAGNOSIS — M545 Low back pain, unspecified: Secondary | ICD-10-CM

## 2021-07-02 MED ORDER — BACLOFEN 10 MG PO TABS: ORAL_TABLET | ORAL | 0 refills | Status: DC

## 2021-07-02 NOTE — Progress Notes (Signed)
[facility]  PATIENT: Lindsay Carter DOB: [birth year]  REFERRING DOCTOR OR PCP:  Milagros Evener [facility])   SOURCE: patient and [facility] records                    _________________________________   HISTORICAL  CHIEF COMPLAINT:  Chief Complaint  Patient presents with   Follow-up    Rm 1, alone. Here for new onset of back pn, causing problems w standing/sleeping.     HISTORY OF PRESENT ILLNESS:  Lindsay Carter is a 51 y.o. woman with relapsing remitting multiple sclerosis.   Update 07/02/2021: She had the onset of LBP 2 days ago but pain was much more intense when she woke up yesterday morning.   Pain went from LB to the right hip.  Pain did not enter the leg.    She felt a little weaker in the right leg. She felt she needed to use furniture to walk around.   She had less pain bent forward while sitting.     No change in bladder.    She did have an upset stomach but notes taking several Tylenol and a Tramadol.  Biofreeze had not helped.   She used a heating pad last night.  She was able to sleep ok last night.   Last time she felt weaker was over the summer 2022.  She had milder weakness that she did yesterday when the temperatures were elevated at that time.    Her MS seems stable.  She is on glatiramer 40 mg two times a week.    She is tolerating it well if she takes a Benadryl before each shot.      She takes gabapentin 200 mg po qHS and it helps her restlessness at night.  Due to the pain her gait has worsened but was baseline 2 days ago.  She has right leg weakness and spasticity.  On a typical day, she can walk about 150 to 200 yards without stopping. And close to a mile without a sitting rest   Some stumbles but no falls.    She uses the rail on stair.  Her right hand is fine.  .   Her right leg has spasticity and left is fine.   She does not need anti-spasticity medication.   She has numbness in her right leg that is always present.      Bladder function is doing well.    Vision is fine.     She has mild fatigue, worse with heat.  Mood is ok.   Cognition is ok unless she is tired.   Insomnia improved with melatonin.   She takes etodolac for joint pain.      MS History:   Prior to 2010, she presented with several episodes of numbness and MRI of the spine was reportedly normal (however on my recent review there appear to be 2 thoracic plaques at T4T5 and T6T7).   In 2010, she had an episode of limping and was referred to me.   MRI of the spine showed one focus and MRI of the brain showed multiple foci consistent with t multiple sclerosis.  There was one enhancing plaque in the left posterior frontal lobe.   She was started on Copaxone and noted mild lipoatrophy. Of note, she is HLA DR 1501 positive. She was clinically stable, she initially went to Copaxone 20 mg every other day in 2012 and then switched to 40 mg 3 times a  week in 2014. In 2015 she switch to 40 mg twice a week and has remained relapse free.   Imaging review:  MRI of the brain 09/27/2018 shows T2/FLAIR hyperintense foci in the hemispheres in a pattern and configuration consistent with chronic demyelinating plaque associated with multiple sclerosis. None of the foci appeared to be acute. When compared to the MRI dated 08/11/2016, there was no interval change.   There is a normal enhancement pattern. There are no acute findings.  MRI of the cervical spine 08/11/2016 shows a focus within the right anterior spinal cord adjacent to C4 consistent with a chronic demyelinating plaque associated with multiple sclerosis    At C2-C3, there is left facet hypertrophy social with a large spur that contacts the thecal sac without causing spinal cord compression. There is no nerve root compression.    At C5-C6, there is a left paramedian disc protrusion causing mild left foraminal narrowing but no nerve root compression.    There is a normal enhancement pattern.   REVIEW OF  SYSTEMS: Constitutional: No fevers, chills, sweats, or change in appetite.   Has fatigue Eyes: No visual changes, double vision, eye pain Ear, nose and throat: No hearing loss, ear pain, nasal congestion, sore throat Cardiovascular: No chest pain, palpitations Respiratory:  No shortness of breath at rest or with exertion.   No wheezes GastrointestinaI: No nausea, vomiting, diarrhea, abdominal pain, fecal incontinence Genitourinary:  No dysuria, urinary retention.  Minimal frequency and minimal stress incontinence.  No nocturia. Musculoskeletal:  No neck pain.  Mild Lower back pain.  Has R and L knee pain, right hip pain.  Integumentary: No rash, pruritus, skin lesions Neurological: as above Psychiatric: No depression at this time.  No anxiety Endocrine: No palpitations, diaphoresis, change in appetite, change in weigh or increased thirst Hematologic/Lymphatic:  No anemia, purpura, petechiae. Allergic/Immunologic: No itchy/runny eyes, nasal congestion, recent allergic reactions, rashes  ALLERGIES: Allergies  Allergen Reactions   Erythromycin Nausea And Vomiting    HOME MEDICATIONS:  Current Outpatient Medications:    baclofen (LIORESAL) 10 MG tablet, One po tid prn, Disp: 30 each, Rfl: 0   acetaminophen (TYLENOL) 500 MG tablet, Take 1,000 mg by mouth every 6 (six) hours as needed., Disp: , Rfl:    cetirizine (ZYRTEC) 10 MG tablet, Take 10 mg by mouth daily., Disp: , Rfl: 4   cholecalciferol (VITAMIN D) 1000 UNITS tablet, Take 2,000 Units by mouth daily., Disp: , Rfl:    Cranberry 125 MG TABS, Take 125 mg by mouth daily., Disp: , Rfl:    etodolac (LODINE) 400 MG tablet, TAKE 1 TABLET BY MOUTH TWICE A DAY, Disp: 180 tablet, Rfl: 1   fluticasone (FLONASE) 50 MCG/ACT nasal spray, , Disp: , Rfl: 2   gabapentin (NEURONTIN) 100 MG capsule, Take 1 capsule (100 mg total) by mouth 3 (three) times daily., Disp: 270 capsule, Rfl: 3   Glatiramer Acetate 40 MG/ML SOSY, Inject 40 mg into the skin 2  (two) times a week., Disp: 24 mL, Rfl: 1   hyoscyamine (ANASPAZ) 0.125 MG TBDP disintergrating tablet, Place 0.125 mg under the tongue as needed. , Disp: , Rfl:    JUNEL 1/20 1-20 MG-MCG tablet, Take 1 tablet by mouth daily., Disp: , Rfl: 11   loratadine-pseudoephedrine (CLARITIN-D 12-HOUR) 5-120 MG tablet, Take 1 tablet by mouth at bedtime as needed for allergies., Disp: , Rfl:    Melatonin 3 MG CAPS, Take 3 mg by mouth at bedtime., Disp: , Rfl:    Minoxidil  5 % FOAM, Apply topically., Disp: , Rfl:    Multiple Vitamin (MULTIVITAMIN) tablet, Take 1 tablet by mouth daily., Disp: , Rfl:    polyethylene glycol (MIRALAX / GLYCOLAX) packet, Take 17 g by mouth daily., Disp: , Rfl:    Rutin 50 MG TABS, Take 50 tablets by mouth daily. , Disp: , Rfl:    simvastatin (ZOCOR) 10 MG tablet, Take 10 mg by mouth every evening., Disp: , Rfl: 1   SOOLANTRA 1 % CREA, Apply 1 application. topically daily., Disp: , Rfl:    vitamin C (ASCORBIC ACID) 500 MG tablet, Take 1,000 mg by mouth daily. Takes gummy, not tablet, Disp: , Rfl:   PAST MEDICAL HISTORY: Past Medical History:  Diagnosis Date   Headache    Multiple sclerosis ([place])    Vision abnormalities     PAST SURGICAL HISTORY: Past Surgical History:  Procedure Laterality Date   KNEE ARTHROSCOPY Left    2006    FAMILY HISTORY: Family History  Problem Relation Age of Onset   Hypertension Mother    Ataxia Father    CAD Father    Parkinson's disease Father    Bladder Cancer Father    Breast cancer Paternal Aunt 44   Breast cancer Maternal Grandmother 42    SOCIAL HISTORY:  Social History   Socioeconomic History   Marital status: Married    Spouse name: Not on file   Number of children: Not on file   Years of education: Not on file   Highest education level: Not on file  Occupational History   Occupation: Glass blower/designer  Tobacco Use   Smoking status: Never   Smokeless tobacco: Never  Vaping Use   Vaping Use: Never used  Substance  and Sexual Activity   Alcohol use: Yes    Alcohol/week: 0.0 standard drinks    Comment: 2-3 beers per week/fim   Drug use: No   Sexual activity: Not on file  Other Topics Concern   Not on file  Social History Narrative   Right handed    Caffeine use: 1/2 cup coffee per day   Social Determinants of Health   Financial Resource Strain: Not on file  Food Insecurity: Not on file  Transportation Needs: Not on file  Physical Activity: Not on file  Stress: Not on file  Social Connections: Not on file  Intimate Partner Violence: Not on file     PHYSICAL EXAM  Vitals:   07/02/21 1314  BP: (!) 168/97  Pulse: (!) 126  Weight: 203 lb (92.1 kg)  Height: 5' 8.5" (1.74 m)    Body mass index is 30.42 kg/m.   General: The patient is well-developed and well-nourished and in no acute distress .  She has tenderness over the right piriformis muscle and bilateral lower lumbar paraspinal muscles.  The trochanteric bursae would not significantly tender.   Neurologic Exam  Mental status: The patient is alert and oriented x 3 at the time of the examination. The patient has apparent normal recent and remote memory, with an apparently normal attention span and concentration ability.   Speech is normal.  Cranial nerves: Extraocular movements are full.   Facial strength and sensation is normal.  Trapezius strength is normal.  No dysarthria is noted.  No obvious hearing deficits are noted.  Motor:  Muscle bulk is normal.  She has increased muscle tone in the legs, right greater than left.   Strength is 5/5 in the arms and the left leg. Strength is 4-/5 riht hip flexor, 4/5 at the right ankle and toes. 4+/5 quads/hamstrings.   Sensory:  There was intact sensation to touch and vibration in the arms and slight asymmetry of  vibration sensation in legs with normal temp sensation    Coordination: She has good finger-nose-finger bilaterally.  Heel-to-shin is performed well on the left but reduced on the right.  Gait and station: Station is normal.   There is a right foot drop..  Gait is mildly wide.   Tandem gait is wide and  Romberg is negative.  Reflexes: Deep tendon reflexes are increased in both legs.  There is spread at the knees and nonsustained clonus at the right ankle.        Relapsing remitting multiple sclerosis (HCC)  Right-sided low back pain without sciatica, unspecified chronicity  Multiple joint pain   1.   She has severe back pain yesterday but is better today.  She has some tenderness over the paraspinal and right piriformis muscles.  Possibly her pain was due to muscle spasms.  I will send her a prescription of baclofen to take as needed.  We discussed that it may make her sleepy.  I will hold off on imaging at this time because she is much better yesterday.  However, if she does not improve I would want to do an MRI of the lumbar spine to further evaluate the etiology of her symptoms.   2.   Continue Copaxone twice weekly.   3.   Stay active and exercise.  4.   rtc 6 months or sooner if new or worsening neurologic symptoms.      Neima Lacross A. Felecia Shelling, MD, PhD 3/90/3009, 2:33 PM Certified in Neurology, Clinical Neurophysiology, Sleep Medicine, Pain Medicine and Neuroimaging  [facility] [address] [place], [place] [address] [phone]

## 2021-07-27 ENCOUNTER — Encounter: Payer: Self-pay | Admitting: Neurology

## 2021-07-27 ENCOUNTER — Ambulatory Visit: Payer: BC Managed Care – PPO | Admitting: Neurology

## 2021-07-27 VITALS — BP 144/98 | HR 108 | Ht 68.5 in | Wt 201.0 lb

## 2021-07-27 DIAGNOSIS — R208 Other disturbances of skin sensation: Secondary | ICD-10-CM | POA: Diagnosis not present

## 2021-07-27 DIAGNOSIS — G4700 Insomnia, unspecified: Secondary | ICD-10-CM

## 2021-07-27 DIAGNOSIS — M21371 Foot drop, right foot: Secondary | ICD-10-CM | POA: Diagnosis not present

## 2021-07-27 DIAGNOSIS — G35 Multiple sclerosis: Secondary | ICD-10-CM | POA: Diagnosis not present

## 2021-07-27 DIAGNOSIS — R5383 Other fatigue: Secondary | ICD-10-CM

## 2021-07-27 NOTE — Progress Notes (Signed)
[facility]  PATIENT: Lindsay Carter DOB: Jul 06, 1970  REFERRING DOCTOR OR PCP:  Milagros Evener [facility])   SOURCE: patient and [facility] records                    _________________________________   HISTORICAL  CHIEF COMPLAINT:  Chief Complaint  Patient presents with   Follow-up    Rm 1, alone. Here for 6 month MS f/u, on glatiramer acetate and tolerating well. No longer having muscle spasms.     HISTORY OF PRESENT ILLNESS:  Lindsay Carter is a 51 y.o. woman with relapsing remitting multiple sclerosis.   Update 07/27/2021: I saw her in between scheduled visits last month due to back pain and spasms.  Symptoms had begun to improve before the visit.  Her back spasms have improved further and she feels that have resolved at this time.     She never needed the baclofen.      Her MS seems stable.  She is on glatiramer 40 mg two times a week.    She is tolerating it well if she takes a Benadryl before each shot.      She takes gabapentin 200 mg po qHS and it helps her restlessness at night.  Her gait is variable.     She has some days that are worse.      She has no falls x 2 years but stumbles due to the right leg weakness and right footdrop. She also has right leg spasticity.    On a typical day, she can walk about 150 meters without stopping.    She uses the rail on stair.  Her right hand is fine.  She has no arm weakness but has some right tingling at times.    She does not need anti-spasticity medication.   She has numbness in her right leg that is always present.     Bladder function is doing well.    Vision is fine.     She has mild stable fatigue, worse with heat.  Mood is ok.   Cognition is fine.   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   She takes etodolac for joint pain.      MS History:   Prior to 2010, she presented with several episodes of numbness and MRI of the  spine was reportedly normal (however on my recent review there appear to be 2 thoracic plaques at T4T5 and T6T7).   In 2010, she had an episode of limping and was referred to me.   MRI of the spine showed one focus and MRI of the brain showed multiple foci consistent with t multiple sclerosis.  There was one enhancing plaque in the left posterior frontal lobe.   She was started on Copaxone and noted mild lipoatrophy. Of note, she is HLA DR 1501 positive. She was clinically stable, she initially went to Copaxone 20 mg every other day in 2012 and then switched to 40 mg 3 times a week in 2014. In 2015 she switch to 40 mg twice a week and has remained relapse free.   Imaging review:  MRI of the brain 09/27/2018 shows T2/FLAIR hyperintense foci in the hemispheres in a pattern and configuration consistent with chronic demyelinating plaque associated with multiple sclerosis. None of the foci appeared to be acute. When compared to the MRI dated 08/11/2016, there was no interval change.  There is a normal enhancement pattern. There are no acute findings.  MRI of the cervical spine 08/11/2016 shows a focus within the right anterior spinal cord adjacent to C4 consistent with a chronic demyelinating plaque associated with multiple sclerosis    At C2-C3, there is left facet hypertrophy social with a large spur that contacts the thecal sac without causing spinal cord compression. There is no nerve root compression.    At C5-C6, there is a left paramedian disc protrusion causing mild left foraminal narrowing but no nerve root compression.    There is a normal enhancement pattern.   REVIEW OF SYSTEMS: Constitutional: No fevers, chills, sweats, or change in appetite.   Has fatigue Eyes: No visual changes, double vision, eye pain Ear, nose and throat: No hearing loss, ear pain, nasal congestion, sore throat Cardiovascular: No chest pain, palpitations Respiratory:  No shortness of breath at rest or with exertion.   No  wheezes GastrointestinaI: No nausea, vomiting, diarrhea, abdominal pain, fecal incontinence Genitourinary:  No dysuria, urinary retention.  Minimal frequency and minimal stress incontinence.  No nocturia. Musculoskeletal:  No neck pain.  Mild Lower back pain.  Has R and L knee pain, right hip pain.  Integumentary: No rash, pruritus, skin lesions Neurological: as above Psychiatric: No depression at this time.  No anxiety Endocrine: No palpitations, diaphoresis, change in appetite, change in weigh or increased thirst Hematologic/Lymphatic:  No anemia, purpura, petechiae. Allergic/Immunologic: No itchy/runny eyes, nasal congestion, recent allergic reactions, rashes  ALLERGIES: Allergies  Allergen Reactions   Erythromycin Nausea And Vomiting    HOME MEDICATIONS:  Current Outpatient Medications:    acetaminophen (TYLENOL) 500 MG tablet, Take 1,000 mg by mouth every 6 (six) hours as needed., Disp: , Rfl:    baclofen (LIORESAL) 10 MG tablet, One po tid prn, Disp: 30 each, Rfl: 0   cetirizine (ZYRTEC) 10 MG tablet, Take 10 mg by mouth daily., Disp: , Rfl: 4   cholecalciferol (VITAMIN D) 1000 UNITS tablet, Take 2,000 Units by mouth daily., Disp: , Rfl:    Cranberry 125 MG TABS, Take 125 mg by mouth daily., Disp: , Rfl:    etodolac (LODINE) 400 MG tablet, TAKE 1 TABLET BY MOUTH TWICE A DAY, Disp: 180 tablet, Rfl: 1   fluticasone (FLONASE) 50 MCG/ACT nasal spray, , Disp: , Rfl: 2   gabapentin (NEURONTIN) 100 MG capsule, Take 1 capsule (100 mg total) by mouth 3 (three) times daily., Disp: 270 capsule, Rfl: 3   Glatiramer Acetate 40 MG/ML SOSY, Inject 40 mg into the skin 2 (two) times a week., Disp: 24 mL, Rfl: 1   hyoscyamine (ANASPAZ) 0.125 MG TBDP disintergrating tablet, Place 0.125 mg under the tongue as needed. , Disp: , Rfl:    JUNEL 1/20 1-20 MG-MCG tablet, Take 1 tablet by mouth daily., Disp: , Rfl: 11   loratadine-pseudoephedrine (CLARITIN-D 12-HOUR) 5-120 MG tablet, Take 1 tablet by mouth  at bedtime as needed for allergies., Disp: , Rfl:    Melatonin 3 MG CAPS, Take 3 mg by mouth at bedtime., Disp: , Rfl:    Minoxidil 5 % FOAM, Apply topically., Disp: , Rfl:    Multiple Vitamin (MULTIVITAMIN) tablet, Take 1 tablet by mouth daily., Disp: , Rfl:    polyethylene glycol (MIRALAX / GLYCOLAX) packet, Take 17 g by mouth daily., Disp: , Rfl:    Rutin 50 MG TABS, Take 50 tablets by mouth daily. , Disp: , Rfl:    simvastatin (ZOCOR) 10 MG tablet, Take 10 mg by  mouth every evening., Disp: , Rfl: 1   SOOLANTRA 1 % CREA, Apply 1 application. topically daily., Disp: , Rfl:    vitamin C (ASCORBIC ACID) 500 MG tablet, Take 1,000 mg by mouth daily. Takes gummy, not tablet, Disp: , Rfl:   PAST MEDICAL HISTORY: Past Medical History:  Diagnosis Date   Headache    Multiple sclerosis ([place])    Vision abnormalities     PAST SURGICAL HISTORY: Past Surgical History:  Procedure Laterality Date   KNEE ARTHROSCOPY Left    2006    FAMILY HISTORY: Family History  Problem Relation Age of Onset   Hypertension Mother    Ataxia Father    CAD Father    Parkinson's disease Father    Bladder Cancer Father    Breast cancer Paternal Aunt 42   Breast cancer Maternal Grandmother 31    SOCIAL HISTORY:  Social History   Socioeconomic History   Marital status: Married    Spouse name: Not on file   Number of children: Not on file   Years of education: Not on file   Highest education level: Not on file  Occupational History   Occupation: Glass blower/designer  Tobacco Use   Smoking status: Never   Smokeless tobacco: Never  Vaping Use   Vaping Use: Never used  Substance and Sexual Activity   Alcohol use: Yes    Alcohol/week: 0.0 standard drinks of alcohol    Comment: 2-3 beers per week/fim   Drug use: No   Sexual activity: Not on file  Other Topics Concern   Not on file  Social History Narrative   Right handed    Caffeine use: 1/2 cup coffee per day   Social Determinants of Health    Financial Resource Strain: Not on file  Food Insecurity: Not on file  Transportation Needs: Not on file  Physical Activity: Not on file  Stress: Not on file  Social Connections: Not on file  Intimate Partner Violence: Not on file     PHYSICAL EXAM  Vitals:   07/27/21 0805  BP: (!) 144/98  Pulse: (!) 108  SpO2: 98%  Weight: 201 lb (91.2 kg)  Height: 5' 8.5" (1.74 m)    Body mass index is 30.12 kg/m.   General: The patient is well-developed and well-nourished and in no acute distress .  She has tenderness over the right piriformis muscle and bilateral lower lumbar paraspinal muscles.  The trochanteric bursae would not significantly tender.   Neurologic Exam  Mental status: The patient is alert and oriented x 3 at the time of the examination. The patient has apparent normal recent and remote memory, with an apparently normal attention span and concentration ability.   Speech is normal.  Cranial nerves: Extraocular movements are full.   Facial strength and sensation is normal.  Trapezius strength is normal.  No dysarthria is noted.  No obvious hearing deficits are noted.  Motor:  Muscle bulk is normal.  She has increased muscle tone in the legs, right greater than left.   Strength is 5/5 in the arms and the left leg. Strength is 4-/5 right hip flexor, 4/5 at the right ankle and toes. 4+/5 quads/hamstrings.  Rapid altering movements were performed well in the hands  Sensory:  There was intact sensation to touch and vibration in the arms and slight asymmetry of vibration sensation in legs with normal temp sensation    Coordination: She has good finger-nose-finger bilaterally.  Heel-to-shin is performed well on the left but reduced on the right.  Gait and station: Station is normal.  The gait is mildly  wide and she has right foot drop.   Tandem gait is wide and  Romberg is negative.  Reflexes: Deep tendon reflexes are increased in both legs.  There is spread at the knees and nonsustained clonus at the right ankle.        Multiple sclerosis (HCC)  Right foot drop  Dysesthesia  Insomnia, unspecified type  Other fatigue   1.  Continue Copaxone twice weekly.   2.   Back pain and spasms have improved.  No need for baclofen at this time.  She will continue the NSAID and the nighttime gabapentin. 3.   Stay active and exercise.  4.   rtc 6 months or sooner if new or worsening neurologic symptoms.      Jaxon Mynhier A. Felecia Shelling, MD, PhD 3/88/8757, 97:28 PM Certified in Neurology, Clinical Neurophysiology, Sleep Medicine, Pain Medicine and Neuroimaging  [facility] [address] [place], [place] [address] [phone]

## 2021-09-29 ENCOUNTER — Other Ambulatory Visit: Payer: Self-pay | Admitting: Neurology

## 2021-09-29 DIAGNOSIS — H40013 Open angle with borderline findings, low risk, bilateral: Secondary | ICD-10-CM | POA: Diagnosis not present

## 2021-10-08 DIAGNOSIS — E7800 Pure hypercholesterolemia, unspecified: Secondary | ICD-10-CM | POA: Diagnosis not present

## 2021-10-08 DIAGNOSIS — R000 Tachycardia, unspecified: Secondary | ICD-10-CM | POA: Diagnosis not present

## 2021-10-08 DIAGNOSIS — E668 Other obesity: Secondary | ICD-10-CM | POA: Diagnosis not present

## 2021-10-26 ENCOUNTER — Telehealth: Payer: Self-pay | Admitting: Neurology

## 2021-10-26 DIAGNOSIS — G35 Multiple sclerosis: Secondary | ICD-10-CM

## 2021-10-26 MED ORDER — GLATIRAMER ACETATE 40 MG/ML ~~LOC~~ SOSY: 40.0000 mg | PREFILLED_SYRINGE | SUBCUTANEOUS | 2 refills | Status: DC

## 2021-10-26 NOTE — Telephone Encounter (Signed)
E-scribed refill as requested. 

## 2021-10-26 NOTE — Telephone Encounter (Signed)
Pt is calling. Stated she needs a refill on Glatiramer Acetate 40 MG/ML SOSY. Pt is requesting refill be sent to  [facility] -.

## 2021-11-30 NOTE — Progress Notes (Unsigned)
Name: Lindsay Carter  MRN/ DOB: [id number], [birth year]    Age/ Sex: 51 y.o., female    PCP: Aretta Nip, MD   Reason for Endocrinology Evaluation: Hypercalcemia      Date of Initial Endocrinology Evaluation: 12/01/2021     HPI: Ms. Lindsay Carter is a 51 y.o. female with a past medical history of MS and IBS. The patient presented for initial endocrinology clinic visit on 12/01/2021 for consultative assistance with her Hypercalcemia .   Ms. Schlitt indicates that she was first diagnosed with hypercalcemia in 2022, these records are not available to me today.   she has not experienced symptoms of polydipsia,  has alternating constipation with diarrhea that she attributes to MS. She is on MVI  but no lithium,or  HCTZ.  She is on vitamin D supplements 2000 iu daily   She denies history of kidney stones, kidney disease, liver disease, granulomatous disease. She denies  osteoporosis or prior fractures as an adult . Daily dietary calcium intake: 1 serving.. She denies  family history of osteoporosis, parathyroid disease, thyroid disease.         HISTORY:  Past Medical History:  Past Medical History:  Diagnosis Date   Headache    Multiple sclerosis ([place])    Vision abnormalities    Past Surgical History:  Past Surgical History:  Procedure Laterality Date   KNEE ARTHROSCOPY Left    2006    Social History:  reports that she has never smoked. She has never used smokeless tobacco. She reports current alcohol use. She reports that she does not use drugs. Family History: family history includes Ataxia in her father; Bladder Cancer in her father; Breast cancer (age of onset: 11) in her maternal grandmother; Breast cancer (age of onset: 41) in her paternal aunt; CAD in her father; Hypertension in her mother; Parkinson's disease in her father.   HOME MEDICATIONS: Allergies as of 12/01/2021       Reactions   Erythromycin Nausea And Vomiting        Medication List         Accurate as of December 01, 2021  8:46 AM. If you have any questions, ask your nurse or doctor.          acetaminophen 500 MG tablet Commonly known as: TYLENOL Take 1,000 mg by mouth every 6 (six) hours as needed.   ascorbic acid 500 MG tablet Commonly known as: VITAMIN C Take 1,000 mg by mouth daily. Takes gummy, not tablet   baclofen 10 MG tablet Commonly known as: LIORESAL One po tid prn   cetirizine 10 MG tablet Commonly known as: ZYRTEC Take 10 mg by mouth daily.   cholecalciferol 1000 units tablet Commonly known as: VITAMIN D Take 2,000 Units by mouth daily.   Cranberry 125 MG Tabs Take 125 mg by mouth daily.   etodolac 400 MG tablet Commonly known as: LODINE TAKE 1 TABLET BY MOUTH TWICE A DAY   fluticasone 50 MCG/ACT nasal spray Commonly known as: FLONASE   gabapentin 100 MG capsule Commonly known as: NEURONTIN Take 1 capsule (100 mg total) by mouth 3 (three) times daily.   Glatiramer Acetate 40 MG/ML Sosy Inject 40 mg into the skin 2 (two) times a week.   hyoscyamine 0.125 MG Tbdp disintergrating tablet Commonly known as: ANASPAZ Place 0.125 mg under the tongue as needed.   Junel 1/20 1-20 MG-MCG tablet Generic drug: norethindrone-ethinyl estradiol Take 1 tablet by mouth daily.   loratadine-pseudoephedrine 5-120  MG tablet Commonly known as: CLARITIN-D 12-hour Take 1 tablet by mouth at bedtime as needed for allergies.   Melatonin 3 MG Caps Take 3 mg by mouth at bedtime.   Minoxidil 5 % Foam Apply topically.   multivitamin tablet Take 1 tablet by mouth daily.   polyethylene glycol 17 g packet Commonly known as: MIRALAX / GLYCOLAX Take 17 g by mouth daily.   Rutin 50 MG Tabs Take 50 tablets by mouth daily.   simvastatin 10 MG tablet Commonly known as: ZOCOR Take 10 mg by mouth every evening.   Soolantra 1 % Crea Generic drug: Ivermectin Apply 1 application. topically daily.          REVIEW OF SYSTEMS: A comprehensive ROS  was conducted with the patient and is negative except as per HPI     OBJECTIVE:  VS: BP (!) 164/92 (BP Location: Left Arm, Patient Position: Sitting, Cuff Size: Large)   Pulse 100   Ht 5' 8.5" (1.74 m)   Wt 203 lb (92.1 kg)   SpO2 97%   BMI 30.42 kg/m    Wt Readings from Last 3 Encounters:  12/01/21 203 lb (92.1 kg)  07/27/21 201 lb (91.2 kg)  07/02/21 203 lb (92.1 kg)     EXAM: General: Pt appears well and is in NAD  Eyes: External eye exam normal without stare, lid lag or exophthalmos.  EOM intact.  Neck: General: Supple without adenopathy. Thyroid: Thyroid size normal.  No goiter or nodules appreciated.  Lungs: Clear with good BS bilat with no rales, rhonchi, or wheezes  Heart: Auscultation: RRR.  Abdomen: Normoactive bowel sounds, soft, nontender, without masses or organomegaly palpable  Extremities:  BL LE: No pretibial edema normal ROM and strength.  Mental Status: Judgment, insight: Intact Orientation: Oriented to time, place, and person Mood and affect: No depression, anxiety, or agitation     DATA REVIEWED: ***    ASSESSMENT/PLAN/RECOMMENDATIONS:   Hypercalcemia :  -We discussed PTH mediated versus non-PTH mediated -We will proceed with BMP, ionized calcium, PTH, and 24-hour urinary collection for calcium excretion   Recommendations  Stay hydrated Avoid OTC calcium Consume 2-3 servings of dietary calcium daily    Follow-up in 4 months   Signed electronically by: Mack Guise, MD  [facility]  [address] [place], [place] [address] Phone: [phone] FAX: [phone]   CC: Aretta Nip, [place] [address] Phone: [phone] Fax: [phone]   Return to Endocrinology clinic as below: Future Appointments  Date Time Provider [place]  12/01/2021  9:10 AM Tariq Pernell, Melanie Crazier, MD [facility] None  02/10/2022  8:30 AM Sater,  Nanine Means, MD [facility] None

## 2021-12-01 ENCOUNTER — Encounter: Payer: Self-pay | Admitting: Internal Medicine

## 2021-12-01 ENCOUNTER — Ambulatory Visit: Payer: BC Managed Care – PPO | Admitting: Internal Medicine

## 2021-12-01 LAB — BASIC METABOLIC PANEL
BUN: 11 mg/dL (ref 6–23)
CO2: 22 mEq/L (ref 19–32)
Calcium: 10.6 mg/dL — ABNORMAL HIGH (ref 8.4–10.5)
Chloride: 102 mEq/L (ref 96–112)
Creatinine, Ser: 0.7 mg/dL (ref 0.40–1.20)
GFR: 99.5 mL/min (ref >60.00)
Glucose, Bld: 153 mg/dL — ABNORMAL HIGH (ref 70–99)
Potassium: 4.3 mEq/L (ref 3.5–5.1)
Sodium: 136 mEq/L (ref 135–145)

## 2021-12-01 LAB — ALBUMIN: Albumin: 4.3 g/dL (ref 3.5–5.2)

## 2021-12-01 LAB — VITAMIN D 25 HYDROXY (VIT D DEFICIENCY, FRACTURES): VITD: 49.12 ng/mL (ref 30.00–100.00)

## 2021-12-01 NOTE — Patient Instructions (Signed)
Stay Hydrated  Avoid over the counter calcium tablet  Consume 2-3 servings of dietary calcium daily        24-Hour Urine Collection  You will be collecting your urine for a 24-hour period of time. Your timer starts with your first urine of the morning (For example - If you first pee at [place], your timer will start at [place]) [place] away your first urine of the morning Collect your urine every time you pee for the next 24 hours STOP your urine collection 24 hours after you started the collection (For example - You would stop at 9AM the day after you started)

## 2021-12-02 LAB — CALCIUM, IONIZED: Calcium, Ion: 5.5 mg/dL (ref 4.7–5.5)

## 2021-12-02 LAB — PARATHYROID HORMONE, INTACT (NO CA): PTH: 25 pg/mL (ref 16–77)

## 2021-12-03 ENCOUNTER — Telehealth: Payer: Self-pay | Admitting: Internal Medicine

## 2021-12-03 ENCOUNTER — Encounter: Payer: Self-pay | Admitting: Internal Medicine

## 2021-12-03 NOTE — Telephone Encounter (Signed)
Please let the patient know that her calcium remains  slightly elevated  Her parathyroid hormone, kidney function, and vitamin D are normal    I would like for her to go ahead and collect 24-hour urine, but she needs to wait at least 4 weeks from discontinuation of the multivitamin     Thanks

## 2021-12-03 NOTE — Telephone Encounter (Signed)
Patient has been advise and verbalized understanding

## 2021-12-31 ENCOUNTER — Other Ambulatory Visit (HOSPITAL_COMMUNITY): Payer: Self-pay

## 2021-12-31 ENCOUNTER — Telehealth: Payer: Self-pay | Admitting: Neurology

## 2021-12-31 DIAGNOSIS — G35 Multiple sclerosis: Secondary | ICD-10-CM

## 2021-12-31 MED ORDER — GLATIRAMER ACETATE 40 MG/ML ~~LOC~~ SOSY
40.0000 mg | PREFILLED_SYRINGE | SUBCUTANEOUS | 2 refills | Status: DC
  Filled 2021-12-31: qty 8, 28d supply, fill #0
  Filled 2021-12-31: qty 24, fill #0
  Filled 2022-01-19: qty 8, 28d supply, fill #1
  Filled 2022-02-19: qty 8, 28d supply, fill #2
  Filled 2022-03-23: qty 8, 28d supply, fill #3
  Filled 2022-04-27: qty 8, 28d supply, fill #4
  Filled 2022-05-20: qty 8, 28d supply, fill #5
  Filled 2022-06-22: qty 8, 28d supply, fill #6
  Filled 2022-07-15 – 2022-07-30 (×2): qty 8, 28d supply, fill #7
  Filled 2022-08-25: qty 8, 28d supply, fill #8

## 2021-12-31 NOTE — Telephone Encounter (Signed)
[facility] Luc[facility] Passy) [facility] is not in pt's network. Need to send prescription for Glatiramer Acetate 40 MG/ML SOSY to  [facility]  phone [phone],  Fax: [phone]

## 2021-12-31 NOTE — Telephone Encounter (Addendum)
Called pt. Relayed message we got from [facility]. She was not aware of this. She is ok with filling with [facility]. She was supposed to receive shipment from [facility] 01/01/22, next dose due 01/03/22. I e-scribed rx to [facility] and asked for them to expedite. Pt will f/u with them this afternoon about seeing if they can mail it to her. She will call our office back if they have any trouble processing for her.

## 2022-01-01 ENCOUNTER — Other Ambulatory Visit (HOSPITAL_COMMUNITY): Payer: Self-pay

## 2022-01-19 ENCOUNTER — Other Ambulatory Visit (HOSPITAL_COMMUNITY): Payer: Self-pay

## 2022-02-01 ENCOUNTER — Other Ambulatory Visit (HOSPITAL_COMMUNITY): Payer: Self-pay

## 2022-02-10 ENCOUNTER — Ambulatory Visit: Payer: BC Managed Care – PPO | Admitting: Neurology

## 2022-02-10 ENCOUNTER — Encounter: Payer: Self-pay | Admitting: Neurology

## 2022-02-10 ENCOUNTER — Other Ambulatory Visit: Payer: BC Managed Care – PPO

## 2022-02-10 VITALS — BP 150/99 | HR 108 | Ht 68.0 in | Wt 198.8 lb

## 2022-02-10 DIAGNOSIS — G35 Multiple sclerosis: Secondary | ICD-10-CM | POA: Diagnosis not present

## 2022-02-10 DIAGNOSIS — R5383 Other fatigue: Secondary | ICD-10-CM | POA: Diagnosis not present

## 2022-02-10 DIAGNOSIS — G4700 Insomnia, unspecified: Secondary | ICD-10-CM

## 2022-02-10 DIAGNOSIS — M21371 Foot drop, right foot: Secondary | ICD-10-CM

## 2022-02-10 NOTE — Progress Notes (Signed)
[facility]  PATIENT: Lindsay Carter DOB: [birth year]  REFERRING DOCTOR OR PCP:  Lindsay Carter [facility])   SOURCE: patient and [facility] records                    _________________________________   HISTORICAL  CHIEF COMPLAINT:  Chief Complaint  Patient presents with   Room 10    Pt is here Alone. Pt states that she has been holding steady. Pt states nothing new or different.     HISTORY OF PRESENT ILLNESS:  Lindsay Carter is a 51 y.o. woman with relapsing remitting multiple sclerosis.   Update 02/10/2022: Her MS seems stable.  She is on glatiramer 40 mg two times a week.    She is tolerating it well if she takes a Benadryl before each shot.       Her gait is variable.     She has some days that are worse.      She has no falls but some stumbles due to the right leg weakness and right foot drop. She also has right leg spasticity.    On a typical day, she can walk about 150 feet without a rest,  This is a little worse and slower compared to a couple years ago.      She uses the rail on stair.  Her right hand is fine.  She has no arm weakness but has some right tingling at times.    She does not need anti-spasticity medication.   She has numbness in her right leg that is always present.     Bladder function is doing well.    Vision is fine.     She has mild stable fatigue, worse with heat.  Mood is ok.   Cognition is fine.   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   She takes etodolac for joint pain.   She stopped baclofen.   Gabapentin helps the cramps at night.     Vit D 12/01/2021 was normal (49).    She has an office job.   She is remote 3 days a week and at the office 2 days a week.        MS History:   Prior to 2010, she presented with several episodes of numbness and MRI of the spine was reportedly normal (however on my recent review there appear to be 2 thoracic  plaques at T4T5 and T6T7).   In 2010, she had an episode of limping and was referred to me.   MRI of the spine showed one focus and MRI of the brain showed multiple foci consistent with t multiple sclerosis.  There was one enhancing plaque in the left posterior frontal lobe.   She was started on Copaxone and noted mild lipoatrophy. Of note, she is HLA DR 1501 positive. She was clinically stable, she initially went to Copaxone 20 mg every other day in 2012 and then switched to 40 mg 3 times a week in 2014. In 2015 she switch to 40 mg twice a week and has remained relapse free.   Imaging review:  MRI brain 03/04/2021 showed T2/FLAIR hyperintense foci in the periventricular, juxtacortical and deep white matter in a pattern consistent with chronic demyelinating plaque associated with multiple sclerosis. None of the foci appear to be acute. Compared to the MRI from 09/27/2018, there are no new lesions   MRI of the  brain 09/27/2018 shows T2/FLAIR hyperintense foci in the hemispheres in a pattern and configuration consistent with chronic demyelinating plaque associated with multiple sclerosis. None of the foci appeared to be acute. When compared to the MRI dated 08/11/2016, there was no interval change.   There is a normal enhancement pattern. There are no acute findings.  MRI of the cervical spine 08/11/2016 shows a focus within the right anterior spinal cord adjacent to C4 consistent with a chronic demyelinating plaque associated with multiple sclerosis    At C2-C3, there is left facet hypertrophy social with a large spur that contacts the thecal sac without causing spinal cord compression. There is no nerve root compression.    At C5-C6, there is a left paramedian disc protrusion causing mild left foraminal narrowing but no nerve root compression.    There is a normal enhancement pattern.   REVIEW OF SYSTEMS: Constitutional: No fevers, chills, sweats, or change in appetite.   Has fatigue Eyes: No visual changes,  double vision, eye pain Ear, nose and throat: No hearing loss, ear pain, nasal congestion, sore throat Cardiovascular: No chest pain, palpitations Respiratory:  No shortness of breath at rest or with exertion.   No wheezes GastrointestinaI: No nausea, vomiting, diarrhea, abdominal pain, fecal incontinence Genitourinary:  No dysuria, urinary retention.  Minimal frequency and minimal stress incontinence.  No nocturia. Musculoskeletal:  No neck pain.  Mild Lower back pain.  Has R and L knee pain, right hip pain.  Integumentary: No rash, pruritus, skin lesions Neurological: as above Psychiatric: No depression at this time.  No anxiety Endocrine: No palpitations, diaphoresis, change in appetite, change in weigh or increased thirst Hematologic/Lymphatic:  No anemia, purpura, petechiae. Allergic/Immunologic: No itchy/runny eyes, nasal congestion, recent allergic reactions, rashes  ALLERGIES: Allergies  Allergen Reactions   Erythromycin Nausea And Vomiting    HOME MEDICATIONS:  Current Outpatient Medications:    acetaminophen (TYLENOL) 500 MG tablet, Take 1,000 mg by mouth every 6 (six) hours as needed., Disp: , Rfl:    baclofen (LIORESAL) 10 MG tablet, One po tid prn, Disp: 30 each, Rfl: 0   cetirizine (ZYRTEC) 10 MG tablet, Take 10 mg by mouth daily., Disp: , Rfl: 4   cholecalciferol (VITAMIN D) 1000 UNITS tablet, Take 2,000 Units by mouth daily., Disp: , Rfl:    Cranberry 125 MG TABS, Take 125 mg by mouth daily., Disp: , Rfl:    etodolac (LODINE) 400 MG tablet, TAKE 1 TABLET BY MOUTH TWICE A DAY, Disp: 180 tablet, Rfl: 1   fluticasone (FLONASE) 50 MCG/ACT nasal spray, , Disp: , Rfl: 2   gabapentin (NEURONTIN) 100 MG capsule, Take 1 capsule (100 mg total) by mouth 3 (three) times daily., Disp: 270 capsule, Rfl: 3   Glatiramer Acetate 40 MG/ML SOSY, Inject 40 mg into the skin 2 (two) times a week., Disp: 24 mL, Rfl: 2   hyoscyamine (ANASPAZ) 0.125 MG TBDP disintergrating tablet, Place 0.125  mg under the tongue as needed. , Disp: , Rfl:    JUNEL 1/20 1-20 MG-MCG tablet, Take 1 tablet by mouth daily., Disp: , Rfl: 11   loratadine-pseudoephedrine (CLARITIN-D 12-HOUR) 5-120 MG tablet, Take 1 tablet by mouth at bedtime as needed for allergies., Disp: , Rfl:    Melatonin 3 MG CAPS, Take 3 mg by mouth at bedtime., Disp: , Rfl:    Minoxidil 5 % FOAM, Apply topically., Disp: , Rfl:    polyethylene glycol (MIRALAX / GLYCOLAX) packet, Take 17 g by mouth daily., Disp: , Rfl:  Rutin 50 MG TABS, Take 50 tablets by mouth daily. , Disp: , Rfl:    simvastatin (ZOCOR) 10 MG tablet, Take 10 mg by mouth every evening., Disp: , Rfl: 1   SOOLANTRA 1 % CREA, Apply 1 application. topically daily., Disp: , Rfl:    vitamin C (ASCORBIC ACID) 500 MG tablet, Take 1,000 mg by mouth daily. Takes gummy, not tablet, Disp: , Rfl:   PAST MEDICAL HISTORY: Past Medical History:  Diagnosis Date   Headache    Multiple sclerosis ([place])    Vision abnormalities     PAST SURGICAL HISTORY: Past Surgical History:  Procedure Laterality Date   KNEE ARTHROSCOPY Left    2006    FAMILY HISTORY: Family History  Problem Relation Age of Onset   Hypertension Mother    Ataxia Father    CAD Father    Parkinson's disease Father    Bladder Cancer Father    Breast cancer Paternal Aunt 51   Breast cancer Maternal Grandmother 12    SOCIAL HISTORY:  Social History   Socioeconomic History   Marital status: Married    Spouse name: Not on file   Number of children: Not on file   Years of education: Not on file   Highest education level: Not on file  Occupational History   Occupation: Glass blower/designer  Tobacco Use   Smoking status: Never   Smokeless tobacco: Never  Vaping Use   Vaping Use: Never used  Substance and Sexual Activity   Alcohol use: Yes    Alcohol/week: 0.0 standard drinks of alcohol    Comment: 2-3 beers per week/fim   Drug use: No   Sexual activity: Not on file  Other Topics Concern   Not on  file  Social History Narrative   Right handed    Caffeine use: 1/2 cup coffee per day   Social Determinants of Health   Financial Resource Strain: Not on file  Food Insecurity: Not on file  Transportation Needs: Not on file  Physical Activity: Not on file  Stress: Not on file  Social Connections: Not on file  Intimate Partner Violence: Not on file     PHYSICAL EXAM  Vitals:   02/10/22 0803  BP: (!) 150/99  Pulse: (!) 108  Weight: 198 lb 12.8 oz (90.2 kg)  Height: _0  (1.727 m)     Body mass index is 30.23 kg/m.   General: The patient is well-developed and well-nourished and in no acute distress . She has some redness and    Neurologic Exam  Mental status: The patient is alert and oriented x 3 at the time of the examination. The patient has apparent normal recent and remote memory, with an apparently normal attention span and concentration ability.   Speech is normal.  Cranial nerves: Extraocular movements are full.   Facial strength and sensation is normal.  Trapezius strength is normal.  No dysarthria is noted.  No obvious hearing deficits are noted.  Motor:  Muscle bulk is normal.  Muscle tone iincreased in right >>left leg. .   Strength is 5/5 in the arms and the left leg. Strength is 4-/5 right hip flexor, 4/5 at the right ankle and toes. 4+/5 quads/hamstrings.  Rapid altering movements were performed well in the hands  Sensory:  There was intact sensation to touch and vibration in the arms and slight asymmetry of vibration sensation in legs with normal temp sensation    Coordination: She has good finger-nose-finger bilaterally.good left but reduced right heel to shin  Gait and station: Station is normal.  The gait is mildly wide and she has right foot drop.   Tandem gait is wide and   Romberg is negative.  Reflexes: Deep tendon reflexes are increased in both legs.  There is spread at the knees (R>L)  and nonsustained clonus at the right ankle.        Multiple sclerosis (H[facility])  Right foot drop  Insomnia, unspecified type  Other fatigue   1.  Continue Copaxone twice weekly.   2.  If spasticity worsens, can restart baclofen.    Continue gabapentin at bedtime. . 3.   Stay active and exercise.  4.   rtc 6 months or sooner if new or worsening neurologic symptoms.      Azhar Yogi A. Felecia Shelling, MD, PhD 47/53/3917, 9:21 AM Certified in Neurology, Clinical Neurophysiology, Sleep Medicine, Pain Medicine and Neuroimaging  [facility] [address], [place], [place] [address] [phone]

## 2022-02-11 LAB — CALCIUM, URINE, 24 HOUR: Calcium, 24H Urine: 154 mg/24 h

## 2022-02-11 LAB — CREATININE, URINE, 24 HOUR: Creatinine, 24H Ur: 1.54 g/(24.h) (ref 0.50–2.15)

## 2022-02-12 ENCOUNTER — Encounter: Payer: Self-pay | Admitting: Internal Medicine

## 2022-02-19 ENCOUNTER — Other Ambulatory Visit (HOSPITAL_COMMUNITY): Payer: Self-pay

## 2022-02-25 ENCOUNTER — Other Ambulatory Visit: Payer: Self-pay

## 2022-03-01 ENCOUNTER — Other Ambulatory Visit: Payer: Self-pay

## 2022-03-02 ENCOUNTER — Other Ambulatory Visit: Payer: Self-pay

## 2022-03-04 ENCOUNTER — Other Ambulatory Visit (HOSPITAL_COMMUNITY): Payer: Self-pay

## 2022-03-11 DIAGNOSIS — L718 Other rosacea: Secondary | ICD-10-CM | POA: Diagnosis not present

## 2022-03-11 DIAGNOSIS — D225 Melanocytic nevi of trunk: Secondary | ICD-10-CM | POA: Diagnosis not present

## 2022-03-11 DIAGNOSIS — L309 Dermatitis, unspecified: Secondary | ICD-10-CM | POA: Diagnosis not present

## 2022-03-11 DIAGNOSIS — D2261 Melanocytic nevi of right upper limb, including shoulder: Secondary | ICD-10-CM | POA: Diagnosis not present

## 2022-03-17 ENCOUNTER — Other Ambulatory Visit (HOSPITAL_COMMUNITY): Payer: Self-pay

## 2022-03-23 ENCOUNTER — Other Ambulatory Visit (HOSPITAL_COMMUNITY): Payer: Self-pay

## 2022-03-31 DIAGNOSIS — E669 Obesity, unspecified: Secondary | ICD-10-CM | POA: Diagnosis not present

## 2022-03-31 DIAGNOSIS — Z0000 Encounter for general adult medical examination without abnormal findings: Secondary | ICD-10-CM | POA: Diagnosis not present

## 2022-03-31 DIAGNOSIS — E7800 Pure hypercholesterolemia, unspecified: Secondary | ICD-10-CM | POA: Diagnosis not present

## 2022-03-31 DIAGNOSIS — R7303 Prediabetes: Secondary | ICD-10-CM | POA: Diagnosis not present

## 2022-03-31 LAB — COMPREHENSIVE METABOLIC PANEL: EGFR: 100

## 2022-04-01 DIAGNOSIS — H40013 Open angle with borderline findings, low risk, bilateral: Secondary | ICD-10-CM | POA: Diagnosis not present

## 2022-04-02 ENCOUNTER — Other Ambulatory Visit: Payer: Self-pay

## 2022-04-06 ENCOUNTER — Other Ambulatory Visit (HOSPITAL_COMMUNITY): Payer: Self-pay

## 2022-04-08 ENCOUNTER — Other Ambulatory Visit: Payer: Self-pay | Admitting: Neurology

## 2022-04-27 ENCOUNTER — Other Ambulatory Visit (HOSPITAL_COMMUNITY): Payer: Self-pay

## 2022-04-30 ENCOUNTER — Other Ambulatory Visit: Payer: Self-pay

## 2022-05-06 DIAGNOSIS — J3089 Other allergic rhinitis: Secondary | ICD-10-CM | POA: Diagnosis not present

## 2022-05-06 DIAGNOSIS — R062 Wheezing: Secondary | ICD-10-CM | POA: Diagnosis not present

## 2022-05-10 ENCOUNTER — Telehealth: Payer: Self-pay | Admitting: Neurology

## 2022-05-10 NOTE — Telephone Encounter (Signed)
Form signed and faxed to [phone]

## 2022-05-10 NOTE — Telephone Encounter (Signed)
I called [employer] back and was informed that verbal can't be given via phone and they need a signed order faxed to them. The patient does not need to sign the form. Form placed in Dr.Sater inbox to be signed,

## 2022-05-10 NOTE — Telephone Encounter (Signed)
Lindsay Carter from [employer] has called for an update re: the request for the replacement of the [employer]

## 2022-05-20 ENCOUNTER — Other Ambulatory Visit (HOSPITAL_COMMUNITY): Payer: Self-pay

## 2022-05-31 ENCOUNTER — Other Ambulatory Visit: Payer: Self-pay

## 2022-06-03 ENCOUNTER — Encounter: Payer: Self-pay | Admitting: Internal Medicine

## 2022-06-03 ENCOUNTER — Ambulatory Visit: Payer: BC Managed Care – PPO | Admitting: Internal Medicine

## 2022-06-03 VITALS — BP 144/86 | HR 100 | Ht 68.0 in | Wt 196.0 lb

## 2022-06-03 DIAGNOSIS — E210 Primary hyperparathyroidism: Secondary | ICD-10-CM

## 2022-06-03 NOTE — Progress Notes (Signed)
Name: Lindsay Carter  MRN/ DOB: [id number], December 09, 1970    Age/ Sex: 52 y.o., female    PCP: [facility] [place]   Reason for Endocrinology Evaluation: Hypercalcemia      Date of Initial Endocrinology Evaluation: 12/01/2021     HPI: Lindsay Carter is a 52 y.o. female with a past medical history of MS and IBS. The patient presented for initial endocrinology clinic visit on 12/01/2021 for consultative assistance with her Hypercalcemia .   Lindsay Carter indicates that she was first diagnosed with hypercalcemia in 2022, these records are not available to me today.    On her initial visit with me she was on multivitamin but no lithium or HCTZ   She was on vitamin D supplements 2000 iu daily   She denies history of kidney stones, kidney disease, liver disease, granulomatous disease. She denies  osteoporosis or prior fractures as an adult .    She denies  family history of osteoporosis, parathyroid disease, thyroid disease.   24-hour urinary calcium excretion was normal at 154 mg (01/2022)    SUBJECTIVE:    Today (06/03/22): Lindsay Carter is here for follow-up on hypercalcemia  She stopped  MVI  Denies recent falls  Denies recent bone fractures Denies polydipsia but she drinks plenty of water to hydration  She has occasional constipation that she  attributes to MS.  She has occasional muscle cramps  She is on calcium rich food      HISTORY:  Past Medical History:  Past Medical History:  Diagnosis Date   Headache    Multiple sclerosis    Vision abnormalities    Past Surgical History:  Past Surgical History:  Procedure Laterality Date   KNEE ARTHROSCOPY Left    2006    Social History:  reports that she has never smoked. She has never used smokeless tobacco. She reports current alcohol use. She reports that she does not use drugs. Family History: family history includes Ataxia in her father; Bladder Cancer in her father; Breast cancer  (age of onset: 71) in her maternal grandmother; Breast cancer (age of onset: 83) in her paternal aunt; CAD in her father; Hypertension in her mother; Parkinson's disease in her father.   HOME MEDICATIONS: Allergies as of 06/03/2022       Reactions   Erythromycin Nausea And Vomiting        Medication List        Accurate as of June 03, 2022  8:30 AM. If you have any questions, ask your nurse or doctor.          acetaminophen 500 MG tablet Commonly known as: TYLENOL Take 1,000 mg by mouth every 6 (six) hours as needed.   ascorbic acid 500 MG tablet Commonly known as: VITAMIN C Take 1,000 mg by mouth daily. Takes gummy, not tablet   baclofen 10 MG tablet Commonly known as: LIORESAL One po tid prn   cetirizine 10 MG tablet Commonly known as: ZYRTEC Take 10 mg by mouth daily.   cholecalciferol 1000 units tablet Commonly known as: VITAMIN D Take 2,000 Units by mouth daily.   Cranberry 125 MG Tabs Take 125 mg by mouth daily.   etodolac 400 MG tablet Commonly known as: LODINE TAKE 1 TABLET BY MOUTH TWICE A DAY   fluticasone 50 MCG/ACT nasal spray Commonly known as: FLONASE   gabapentin 100 MG capsule Commonly known as: NEURONTIN Take 1 capsule (100 mg total) by mouth 3 (three)  times daily.   Glatiramer Acetate 40 MG/ML Sosy Inject 40 mg into the skin 2 (two) times a week.   hyoscyamine 0.125 MG Tbdp disintergrating tablet Commonly known as: ANASPAZ Place 0.125 mg under the tongue as needed.   Junel 1/20 1-20 MG-MCG tablet Generic drug: norethindrone-ethinyl estradiol Take 1 tablet by mouth daily.   loratadine-pseudoephedrine 5-120 MG tablet Commonly known as: CLARITIN-D 12-hour Take 1 tablet by mouth at bedtime as needed for allergies.   Melatonin 3 MG Caps Take 3 mg by mouth at bedtime.   Minoxidil 5 % Foam Apply topically.   polyethylene glycol 17 g packet Commonly known as: MIRALAX / GLYCOLAX Take 17 g by mouth daily.   Rutin 50 MG  Tabs Take 50 tablets by mouth daily.   simvastatin 10 MG tablet Commonly known as: ZOCOR Take 10 mg by mouth every evening.   Soolantra 1 % Crea Generic drug: Ivermectin Apply 1 application. topically daily.          REVIEW OF SYSTEMS: A comprehensive ROS was conducted with the patient and is negative except as per HPI     OBJECTIVE:  VS: BP (!) 144/86 (BP Location: Left Arm, Patient Position: Sitting, Cuff Size: Large)   Pulse 100   Ht  (1.727 m)   Wt 196 lb (88.9 kg)   SpO2 98%   BMI 29.80 kg/m    Wt Readings from Last 3 Encounters:  06/03/22 196 lb (88.9 kg)  02/10/22 198 lb 12.8 oz (90.2 kg)  12/01/21 203 lb (92.1 kg)     EXAM: General: Pt appears well and is in NAD  Eyes: External eye exam normal without stare, lid lag or exophthalmos.  EOM intact.  Neck: General: Supple without adenopathy. Thyroid: Thyroid size normal.  No goiter or nodules appreciated.  Lungs: Clear with good BS bilat with no rales, rhonchi, or wheezes  Heart: Auscultation: RRR.  Extremities:  BL LE: No pretibial edema   Mental Status: Judgment, insight: Intact Orientation: Oriented to time, place, and person Mood and affect: No depression, anxiety, or agitation     DATA REVIEWED:  04/21/2022  BUN 13 Cr. 0.72 GFR 100 Ca 10.8 Ca- corrected 10.35   Latest Reference Range & Units 12/01/21 09:13  BASIC METABOLIC PANEL  Rpt !  Sodium 135 - 145 mEq/L 136  Potassium 3.5 - 5.1 mEq/L 4.3  Chloride 96 - 112 mEq/L 102  CO2 19 - 32 mEq/L 22  Glucose 70 - 99 mg/dL 409 (H)  BUN 6 - 23 mg/dL 11  Creatinine 8.11 - 9.14 mg/dL 7.82  Calcium 8.4 - 95.6 mg/dL 21.3 (H)  Calcium Ionized 4.7 - 5.5 mg/dL 5.5  Albumin 3.5 - 5.2 g/dL 4.3  GFR >08.65 mL/min 99.50  VITD 30.00 - 100.00 ng/mL 49.12  PTH, Intact 16 - 77 pg/mL 25    ASSESSMENT/PLAN/RECOMMENDATIONS:   1.Primary hyperparathyroidism   - Pt asymptomatic  - 24- hr ur calcium normal 156 mg 01/2022 - Serum calcium slightly  elevated and stable , based on recent labs through PCP 's office  - [facility], order printed and provided for the pt   Recommendations  Stay hydrated Avoid OTC calcium Consume 2-3 servings of dietary calcium daily    Follow-up in 6 months   Signed electronically by: Lyndle Herrlich, MD  [facility] Endocrinology  [facility] [address] [place] [place], [place] [address] Phone: [phone] FAX: [phone]  CC: [facility] [place] [address] [place] [address] Phone: [phone] Fax: [phone]   Return to Endocrinology clinic as below: Future Appointments  Date Time Provider Department Center  02/21/2023  8:30 AM Sater, Pearletha Furl, MD [facility] None

## 2022-06-03 NOTE — Patient Instructions (Signed)
Stay Hydrated  Avoid over the counter calcium tablet  Consume 2-3 servings of dietary calcium daily

## 2022-06-14 ENCOUNTER — Other Ambulatory Visit: Payer: Self-pay | Admitting: Family Medicine

## 2022-06-14 DIAGNOSIS — Z1231 Encounter for screening mammogram for malignant neoplasm of breast: Secondary | ICD-10-CM

## 2022-06-16 ENCOUNTER — Telehealth: Payer: Self-pay | Admitting: Neurology

## 2022-06-16 MED ORDER — GABAPENTIN 100 MG PO CAPS: 100.0000 mg | ORAL_CAPSULE | Freq: Three times a day (TID) | ORAL | 1 refills | Status: DC

## 2022-06-16 NOTE — Telephone Encounter (Signed)
Pt requesting a refill on gabapentin (NEURONTIN) 100 MG capsule. Should be sent to [facility] [phone]

## 2022-06-16 NOTE — Telephone Encounter (Signed)
Last seen 02/10/22 per note "Continue gabapentin at bedtime. "  Rx filled on 03/16/22 #270 (90 days)   Follow up scheduled on 02/21/23

## 2022-06-16 NOTE — Addendum Note (Signed)
Addended by: Aura Camps on: 06/16/2022 03:42 PM   Modules accepted: Orders

## 2022-06-22 ENCOUNTER — Other Ambulatory Visit (HOSPITAL_COMMUNITY): Payer: Self-pay

## 2022-06-25 ENCOUNTER — Other Ambulatory Visit (HOSPITAL_COMMUNITY): Payer: Self-pay

## 2022-06-28 ENCOUNTER — Other Ambulatory Visit (HOSPITAL_COMMUNITY): Payer: Self-pay

## 2022-07-02 ENCOUNTER — Other Ambulatory Visit (HOSPITAL_COMMUNITY): Payer: Self-pay

## 2022-07-06 ENCOUNTER — Other Ambulatory Visit (HOSPITAL_COMMUNITY): Payer: Self-pay

## 2022-07-14 ENCOUNTER — Other Ambulatory Visit (HOSPITAL_COMMUNITY): Payer: Self-pay

## 2022-07-15 ENCOUNTER — Other Ambulatory Visit (HOSPITAL_COMMUNITY): Payer: Self-pay

## 2022-07-16 ENCOUNTER — Other Ambulatory Visit (HOSPITAL_COMMUNITY): Payer: Self-pay

## 2022-07-16 ENCOUNTER — Other Ambulatory Visit: Payer: Self-pay

## 2022-07-19 ENCOUNTER — Ambulatory Visit: Payer: BC Managed Care – PPO

## 2022-07-29 ENCOUNTER — Other Ambulatory Visit (HOSPITAL_COMMUNITY): Payer: Self-pay

## 2022-07-30 ENCOUNTER — Other Ambulatory Visit (HOSPITAL_COMMUNITY): Payer: Self-pay

## 2022-07-30 ENCOUNTER — Other Ambulatory Visit: Payer: Self-pay

## 2022-08-02 ENCOUNTER — Other Ambulatory Visit: Payer: Self-pay

## 2022-08-02 ENCOUNTER — Other Ambulatory Visit (HOSPITAL_COMMUNITY): Payer: Self-pay

## 2022-08-04 ENCOUNTER — Ambulatory Visit
Admission: RE | Admit: 2022-08-04 | Discharge: 2022-08-04 | Disposition: A | Discharge status: Home / Self Care | Payer: BC Managed Care – PPO | Source: Ambulatory Visit | Attending: Family Medicine | Admitting: Family Medicine

## 2022-08-04 DIAGNOSIS — Z1231 Encounter for screening mammogram for malignant neoplasm of breast: Secondary | ICD-10-CM | POA: Diagnosis not present

## 2022-08-25 ENCOUNTER — Other Ambulatory Visit (HOSPITAL_COMMUNITY): Payer: Self-pay

## 2022-08-27 ENCOUNTER — Other Ambulatory Visit (HOSPITAL_COMMUNITY): Payer: Self-pay

## 2022-09-01 ENCOUNTER — Other Ambulatory Visit (HOSPITAL_COMMUNITY): Payer: Self-pay

## 2022-09-02 ENCOUNTER — Other Ambulatory Visit (HOSPITAL_COMMUNITY): Payer: Self-pay

## 2022-09-03 ENCOUNTER — Other Ambulatory Visit (HOSPITAL_COMMUNITY): Payer: Self-pay

## 2022-09-13 ENCOUNTER — Other Ambulatory Visit: Payer: Self-pay

## 2022-09-20 ENCOUNTER — Other Ambulatory Visit: Payer: Self-pay | Admitting: Neurology

## 2022-09-20 NOTE — Telephone Encounter (Signed)
Last seen on 01/21/22 per note "She takes etodolac for joint pain. " Follow up scheduled on 02/21/23

## 2022-09-22 ENCOUNTER — Other Ambulatory Visit: Payer: Self-pay | Admitting: Neurology

## 2022-09-22 ENCOUNTER — Other Ambulatory Visit (HOSPITAL_COMMUNITY): Payer: Self-pay

## 2022-09-22 DIAGNOSIS — G35 Multiple sclerosis: Secondary | ICD-10-CM

## 2022-09-23 ENCOUNTER — Other Ambulatory Visit (HOSPITAL_COMMUNITY): Payer: Self-pay

## 2022-09-23 ENCOUNTER — Other Ambulatory Visit: Payer: Self-pay

## 2022-09-23 MED ORDER — GLATIRAMER ACETATE 40 MG/ML ~~LOC~~ SOSY
40.0000 mg | PREFILLED_SYRINGE | SUBCUTANEOUS | 2 refills | Status: DC
Start: 2022-09-23 — End: 2022-12-27
  Filled 2022-09-23 – 2022-10-07 (×3): qty 8, 28d supply, fill #0
  Filled 2022-10-27: qty 8, 28d supply, fill #1
  Filled 2022-11-26: qty 8, 28d supply, fill #2

## 2022-09-23 NOTE — Telephone Encounter (Signed)
Last seen on 02/10/22 Follow up scheduled on 02/21/23

## 2022-09-29 DIAGNOSIS — E7800 Pure hypercholesterolemia, unspecified: Secondary | ICD-10-CM | POA: Diagnosis not present

## 2022-09-29 DIAGNOSIS — E210 Primary hyperparathyroidism: Secondary | ICD-10-CM | POA: Diagnosis not present

## 2022-09-29 DIAGNOSIS — R7303 Prediabetes: Secondary | ICD-10-CM | POA: Diagnosis not present

## 2022-09-29 DIAGNOSIS — J302 Other seasonal allergic rhinitis: Secondary | ICD-10-CM | POA: Diagnosis not present

## 2022-10-01 ENCOUNTER — Other Ambulatory Visit (HOSPITAL_COMMUNITY): Payer: Self-pay

## 2022-10-01 ENCOUNTER — Other Ambulatory Visit: Payer: Self-pay

## 2022-10-04 ENCOUNTER — Other Ambulatory Visit (HOSPITAL_COMMUNITY): Payer: Self-pay

## 2022-10-04 ENCOUNTER — Other Ambulatory Visit: Payer: Self-pay

## 2022-10-04 ENCOUNTER — Telehealth: Payer: Self-pay | Admitting: Pharmacy Technician

## 2022-10-04 NOTE — Telephone Encounter (Signed)
 Clinical Notes have been submitted

## 2022-10-04 NOTE — Telephone Encounter (Signed)
Pharmacy Patient Advocate Encounter   Received notification from Patient Pharmacy that prior authorization for Glatopa 40MG /ML syringes  is required/requested.   Insurance verification completed.   The patient is insured through [facility] .   Per test claim: PA required; PA started via CoverMyMeds. KEY [id number] . Waiting for clinical questions to populate.

## 2022-10-05 ENCOUNTER — Other Ambulatory Visit (HOSPITAL_COMMUNITY): Payer: Self-pay

## 2022-10-05 ENCOUNTER — Other Ambulatory Visit: Payer: Self-pay

## 2022-10-06 ENCOUNTER — Other Ambulatory Visit: Payer: Self-pay

## 2022-10-06 ENCOUNTER — Other Ambulatory Visit (HOSPITAL_COMMUNITY): Payer: Self-pay

## 2022-10-06 NOTE — Telephone Encounter (Signed)
Pharmacy Patient Advocate Encounter  Received notification from [facility] that Prior Authorization for Glatopa 40MG /ML syringes  has been APPROVED from 10/06/2022 to 10/04/2023   PA #/Case ID/Reference #: [id number]

## 2022-10-07 ENCOUNTER — Other Ambulatory Visit (HOSPITAL_COMMUNITY): Payer: Self-pay

## 2022-10-07 DIAGNOSIS — H40013 Open angle with borderline findings, low risk, bilateral: Secondary | ICD-10-CM | POA: Diagnosis not present

## 2022-10-08 ENCOUNTER — Other Ambulatory Visit: Payer: Self-pay

## 2022-10-08 ENCOUNTER — Other Ambulatory Visit (HOSPITAL_COMMUNITY): Payer: Self-pay

## 2022-10-11 ENCOUNTER — Other Ambulatory Visit: Payer: Self-pay

## 2022-10-12 ENCOUNTER — Other Ambulatory Visit (HOSPITAL_COMMUNITY): Payer: Self-pay

## 2022-10-25 ENCOUNTER — Other Ambulatory Visit (HOSPITAL_COMMUNITY): Payer: Self-pay

## 2022-10-27 ENCOUNTER — Other Ambulatory Visit (HOSPITAL_COMMUNITY): Payer: Self-pay

## 2022-10-28 ENCOUNTER — Other Ambulatory Visit (HOSPITAL_COMMUNITY): Payer: Self-pay

## 2022-10-28 ENCOUNTER — Other Ambulatory Visit: Payer: Self-pay

## 2022-11-01 ENCOUNTER — Other Ambulatory Visit: Payer: Self-pay

## 2022-11-03 ENCOUNTER — Other Ambulatory Visit (HOSPITAL_COMMUNITY): Payer: Self-pay

## 2022-11-21 ENCOUNTER — Other Ambulatory Visit: Payer: Self-pay | Admitting: Neurology

## 2022-11-22 NOTE — Telephone Encounter (Signed)
Last seen on 02/10/22 Follow up scheduled on 02/21/23

## 2022-11-26 ENCOUNTER — Other Ambulatory Visit: Payer: Self-pay

## 2022-11-26 NOTE — Progress Notes (Signed)
Specialty Pharmacy Refill Coordination Note  Lindsay Carter is a 52 y.o. female contacted today regarding refills of specialty medication(s) Glatiramer Acetate   Patient requested [name] at [facility] at [place] date: 11/30/22   Medication will be filled on 11/29/22.

## 2022-11-30 ENCOUNTER — Other Ambulatory Visit: Payer: Self-pay

## 2022-11-30 ENCOUNTER — Other Ambulatory Visit (HOSPITAL_COMMUNITY): Payer: Self-pay

## 2022-12-27 ENCOUNTER — Other Ambulatory Visit: Payer: Self-pay

## 2022-12-27 DIAGNOSIS — G35 Multiple sclerosis: Secondary | ICD-10-CM

## 2022-12-27 MED ORDER — GLATIRAMER ACETATE 40 MG/ML ~~LOC~~ SOSY
40.0000 mg | PREFILLED_SYRINGE | SUBCUTANEOUS | 2 refills | Status: DC
Start: 2022-12-27 — End: 2023-02-28
  Filled 2022-12-27: qty 24, 84d supply, fill #0
  Filled 2022-12-29: qty 8, 28d supply, fill #0
  Filled 2023-01-25: qty 8, 28d supply, fill #1
  Filled 2023-02-24: qty 8, 28d supply, fill #2

## 2022-12-29 ENCOUNTER — Inpatient Hospital Stay
Admission: RE | Admit: 2022-12-29 | Discharge: 2022-12-29 | Disposition: A | Discharge status: Home / Self Care | Payer: BC Managed Care – PPO | Source: Ambulatory Visit | Attending: Internal Medicine | Admitting: Internal Medicine

## 2022-12-29 ENCOUNTER — Other Ambulatory Visit: Payer: Self-pay

## 2022-12-29 DIAGNOSIS — E210 Primary hyperparathyroidism: Secondary | ICD-10-CM

## 2022-12-29 DIAGNOSIS — N958 Other specified menopausal and perimenopausal disorders: Secondary | ICD-10-CM | POA: Diagnosis not present

## 2022-12-29 DIAGNOSIS — G35 Multiple sclerosis: Secondary | ICD-10-CM | POA: Diagnosis not present

## 2022-12-29 NOTE — Progress Notes (Signed)
Specialty Pharmacy Ongoing Clinical Assessment Note  Lindsay Carter is a 52 y.o. female who is being followed by the specialty pharmacy service for RxSp Multiple Sclerosis   Patient's specialty medication(s) reviewed today: Glatiramer Acetate   Missed doses in the last 4 weeks: 0   Patient/Caregiver did not have any additional questions or concerns.   Therapeutic benefit summary: Patient is achieving benefit   Adverse events/side effects summary: No adverse events/side effects   Patient's therapy is appropriate to: Continue    Goals Addressed             This Visit's Progress    Minimize recurrence of flares       Patient is on track. Patient will maintain adherence         Follow up:  6 months  Arwyn Besaw E [facility] Specialty Pharmacist

## 2022-12-29 NOTE — Progress Notes (Signed)
Specialty Pharmacy Refill Coordination Note  Lindsay Carter is a 52 y.o. female contacted today regarding refills of specialty medication(s) Glatiramer Acetate   Patient requested [name] at [facility] at [place] date: 01/06/23   Medication will be filled on 01/05/23.

## 2022-12-31 ENCOUNTER — Encounter: Payer: Self-pay | Admitting: Internal Medicine

## 2023-01-05 ENCOUNTER — Other Ambulatory Visit (HOSPITAL_COMMUNITY): Payer: Self-pay

## 2023-01-06 ENCOUNTER — Other Ambulatory Visit (HOSPITAL_COMMUNITY): Payer: Self-pay

## 2023-01-17 ENCOUNTER — Other Ambulatory Visit: Payer: Self-pay

## 2023-01-25 ENCOUNTER — Other Ambulatory Visit (HOSPITAL_COMMUNITY): Payer: Self-pay | Admitting: Pharmacy Technician

## 2023-01-25 ENCOUNTER — Other Ambulatory Visit (HOSPITAL_COMMUNITY): Payer: Self-pay

## 2023-01-25 ENCOUNTER — Other Ambulatory Visit: Payer: Self-pay

## 2023-01-25 NOTE — Progress Notes (Signed)
Specialty Pharmacy Refill Coordination Note  Lindsay Carter is a 52 y.o. female contacted today regarding refills of specialty medication(s) Glatiramer Acetate   Patient requested Lindsay Carter at [facility] at [place] date: 02/03/23   Medication will be filled on 02/02/23.

## 2023-02-02 ENCOUNTER — Other Ambulatory Visit: Payer: Self-pay

## 2023-02-03 ENCOUNTER — Other Ambulatory Visit: Payer: Self-pay

## 2023-02-08 ENCOUNTER — Other Ambulatory Visit (HOSPITAL_COMMUNITY): Payer: Self-pay

## 2023-02-20 ENCOUNTER — Telehealth: Payer: Self-pay | Admitting: Neurology

## 2023-02-20 NOTE — Telephone Encounter (Signed)
 Phone rep relayed to pt that due to inclement weather her appointment has been cancelled, but that within the week she would be contacted to be r/s, pt was thankful for the call.

## 2023-02-21 ENCOUNTER — Ambulatory Visit: Payer: BC Managed Care – PPO | Admitting: Neurology

## 2023-02-21 NOTE — Telephone Encounter (Signed)
 Called pt and R/S her for 02/28/23 at 3:30pm.

## 2023-02-23 ENCOUNTER — Other Ambulatory Visit: Payer: Self-pay

## 2023-02-24 ENCOUNTER — Other Ambulatory Visit: Payer: Self-pay

## 2023-02-24 NOTE — Progress Notes (Signed)
 Specialty Pharmacy Refill Coordination Note  Lindsay Carter is a 53 y.o. female contacted today regarding refills of specialty medication(s) Glatiramer  Acetate   Patient requested [name] at [facility] at [place] date: 03/08/23   Medication will be filled on 03/07/23.

## 2023-02-28 ENCOUNTER — Other Ambulatory Visit: Payer: Self-pay

## 2023-02-28 ENCOUNTER — Ambulatory Visit: Payer: BC Managed Care – PPO | Admitting: Neurology

## 2023-02-28 ENCOUNTER — Encounter: Payer: Self-pay | Admitting: Neurology

## 2023-02-28 VITALS — BP 145/99 | HR 106 | Ht 68.0 in | Wt 202.5 lb

## 2023-02-28 DIAGNOSIS — R208 Other disturbances of skin sensation: Secondary | ICD-10-CM

## 2023-02-28 DIAGNOSIS — M21371 Foot drop, right foot: Secondary | ICD-10-CM | POA: Diagnosis not present

## 2023-02-28 DIAGNOSIS — R5383 Other fatigue: Secondary | ICD-10-CM

## 2023-02-28 DIAGNOSIS — G35 Multiple sclerosis: Secondary | ICD-10-CM

## 2023-02-28 DIAGNOSIS — G4700 Insomnia, unspecified: Secondary | ICD-10-CM

## 2023-02-28 MED ORDER — GLATIRAMER ACETATE 40 MG/ML ~~LOC~~ SOSY
40.0000 mg | PREFILLED_SYRINGE | SUBCUTANEOUS | 3 refills | Status: DC
  Filled 2023-02-28: qty 24, 84d supply, fill #0

## 2023-02-28 NOTE — Progress Notes (Signed)
 [facility]  PATIENT: Lindsay Carter DOB: 03-Jul-1970  REFERRING DOCTOR OR PCP:  [facility]   SOURCE: patient and [facility] records                    _________________________________   HISTORICAL  CHIEF COMPLAINT:  Chief Complaint  Patient presents with   Follow-up    RM 11,alone. Last seen 02/10/22.  MS DMT: Glatiramer  acetate (DAW on prescription needed).     HISTORY OF PRESENT ILLNESS:  Lindsay Carter is a 53 y.o. woman with relapsing remitting multiple sclerosis.   Update 02/28/2023 She feels her MS is stable.  She is on glatiramer  40 mg two times a week.   She is tolerating it well if she takes a Benadryl before each shot.       Her gait is stable.  She has a  right foot drop. She also has right leg spasticity, worse in cold weather.    On a typical day, she can walk about 150 feet without a rest,      She uses the rail on stair.  Her right hand is fine.  She has no arm weakness but has some right tingling at times.    She does not need anti-spasticity medication.   She has numbness in her right leg that is always present.     Bladder function is doing well.    Vision is fine.     She has mild stable fatigue, worse with heat.  Mood is ok.   Cognition is fine.   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   She takes etodolac  for joint pain.   She stopped baclofen .   Gabapentin  helps the cramps at night.     Vit D 12/01/2021 was normal (49).    She has an office job.   She is remote 3 days a week and at the office 2 days a week.        MS History:   Prior to 2010, she presented with several episodes of numbness and MRI of the spine was reportedly normal (however on my recent review there appear to be 2 thoracic plaques at T4T5 and T6T7).   In 2010, she had an episode of limping and was referred to me.   MRI of the spine showed one focus and MRI of the brain  showed multiple foci consistent with t multiple sclerosis.  There was one enhancing plaque in the left posterior frontal lobe.   She was started on Copaxone  and noted mild lipoatrophy. Of note, she is HLA DR 1501 positive. She was clinically stable, she initially went to Copaxone  20 mg every other day in 2012 and then switched to 40 mg 3 times a week in 2014. In 2015 she switch to 40 mg twice a week and has remained relapse free.   Imaging review:  MRI brain 03/04/2021 showed T2/FLAIR hyperintense foci in the periventricular, juxtacortical and deep white matter in a pattern consistent with chronic demyelinating plaque associated with multiple sclerosis. None of the foci appear to be acute. Compared to the MRI from 09/27/2018, there are no new lesions   MRI of the brain 09/27/2018 shows T2/FLAIR hyperintense foci in the hemispheres in a pattern and configuration consistent with chronic demyelinating plaque associated with multiple sclerosis. None of the foci appeared to be acute. When compared to the MRI dated 08/11/2016, there  was no interval change.   There is a normal enhancement pattern. There are no acute findings.  MRI of the cervical spine 08/11/2016 shows a focus within the right anterior spinal cord adjacent to C4 consistent with a chronic demyelinating plaque associated with multiple sclerosis    At C2-C3, there is left facet hypertrophy social with a large spur that contacts the thecal sac without causing spinal cord compression. There is no nerve root compression.    At C5-C6, there is a left paramedian disc protrusion causing mild left foraminal narrowing but no nerve root compression.    There is a normal enhancement pattern.   REVIEW OF SYSTEMS: Constitutional: No fevers, chills, sweats, or change in appetite.   Has fatigue Eyes: No visual changes, double vision, eye pain Ear, nose and throat: No hearing loss, ear pain, nasal congestion, sore throat Cardiovascular: No chest pain,  palpitations Respiratory:  No shortness of breath at rest or with exertion.   No wheezes GastrointestinaI: No nausea, vomiting, diarrhea, abdominal pain, fecal incontinence Genitourinary:  No dysuria, urinary retention.  Minimal frequency and minimal stress incontinence.  No nocturia. Musculoskeletal:  No neck pain.  Mild Lower back pain.  Has R and L knee pain, right hip pain.  Integumentary: No rash, pruritus, skin lesions Neurological: as above Psychiatric: No depression at this time.  No anxiety Endocrine: No palpitations, diaphoresis, change in appetite, change in weigh or increased thirst Hematologic/Lymphatic:  No anemia, purpura, petechiae. Allergic/Immunologic: No itchy/runny eyes, nasal congestion, recent allergic reactions, rashes  ALLERGIES: Allergies  Allergen Reactions   Erythromycin Nausea And Vomiting    HOME MEDICATIONS:  Current Outpatient Medications:    acetaminophen (TYLENOL) 500 MG tablet, Take 1,000 mg by mouth every 6 (six) hours as needed., Disp: , Rfl:    baclofen  (LIORESAL ) 10 MG tablet, One po tid prn, Disp: 30 each, Rfl: 0   cetirizine (ZYRTEC) 10 MG tablet, Take 10 mg by mouth daily., Disp: , Rfl: 4   cholecalciferol (VITAMIN D ) 1000 UNITS tablet, Take 2,000 Units by mouth daily., Disp: , Rfl:    Cranberry 125 MG TABS, Take 125 mg by mouth daily., Disp: , Rfl:    etodolac  (LODINE ) 400 MG tablet, TAKE 1 TABLET BY MOUTH TWICE A DAY, Disp: 180 tablet, Rfl: 1   fluticasone (FLONASE) 50 MCG/ACT nasal spray, , Disp: , Rfl: 2   gabapentin  (NEURONTIN ) 100 MG capsule, TAKE 1 CAPSULE (100 MG TOTAL) BY MOUTH THREE TIMES DAILY., Disp: 270 capsule, Rfl: 1   hyoscyamine (ANASPAZ) 0.125 MG TBDP disintergrating tablet, Place 0.125 mg under the tongue as needed. , Disp: , Rfl:    JUNEL 1/20 1-20 MG-MCG tablet, Take 1 tablet by mouth daily., Disp: , Rfl: 11   loratadine-pseudoephedrine (CLARITIN-D 12-HOUR) 5-120 MG tablet, Take 1 tablet by mouth at bedtime as needed for  allergies., Disp: , Rfl:    Melatonin 3 MG CAPS, Take 3 mg by mouth at bedtime., Disp: , Rfl:    Menthol, Topical Analgesic, (BIOFREEZE EX), Apply 1 tablet topically daily. Right hip, Disp: , Rfl:    Minoxidil 5 % FOAM, Apply topically., Disp: , Rfl:    polyethylene glycol (MIRALAX / GLYCOLAX) packet, Take 17 g by mouth daily., Disp: , Rfl:    rosuvastatin (CRESTOR) 20 MG tablet, Take 20 mg by mouth daily., Disp: , Rfl:    Rutin 50 MG TABS, Take 50 tablets by mouth daily. , Disp: , Rfl:    SOOLANTRA 1 % CREA, Apply 1 application. topically  daily., Disp: , Rfl:    vitamin C (ASCORBIC ACID) 500 MG tablet, Take 1,000 mg by mouth daily. Takes gummy, not tablet, Disp: , Rfl:    Glatiramer  Acetate 40 MG/ML SOSY, Inject 40 mg into the skin 2 (two) times a week., Disp: 24 mL, Rfl: 3  PAST MEDICAL HISTORY: Past Medical History:  Diagnosis Date   Headache    Multiple sclerosis (HCC)    Vision abnormalities     PAST SURGICAL HISTORY: Past Surgical History:  Procedure Laterality Date   KNEE ARTHROSCOPY Left    2006    FAMILY HISTORY: Family History  Problem Relation Age of Onset   Hypertension Mother    Ataxia Father    CAD Father    Parkinson's disease Father    Bladder Cancer Father    Breast cancer Paternal Aunt 63   Breast cancer Maternal Grandmother 80    SOCIAL HISTORY:  Social History   Socioeconomic History   Marital status: Married    Spouse name: Not on file   Number of children: Not on file   Years of education: Not on file   Highest education level: Not on file  Occupational History   Occupation: Print production planner  Tobacco Use   Smoking status: Never   Smokeless tobacco: Never  Vaping Use   Vaping status: Never Used  Substance and Sexual Activity   Alcohol use: Yes    Alcohol/week: 0.0 standard drinks of alcohol    Comment: 2-3 beers per week/fim   Drug use: No   Sexual activity: Not on file  Other Topics Concern   Not on file  Social History Narrative    Right handed    Caffeine use: 1/2 cup coffee per day   Social Drivers of Health   Financial Resource Strain: Not on file  Food Insecurity: Not on file  Transportation Needs: Not on file  Physical Activity: Not on file  Stress: Not on file  Social Connections: Not on file  Intimate Partner Violence: Not on file     PHYSICAL EXAM  Vitals:   02/28/23 1529 02/28/23 1541  BP: (!) 172/111 (!) 145/99  Pulse: (!) 108 (!) 106  Weight: 202 lb 8 oz (91.9 kg)   Height: 5' 8 (1.727 m)      Body mass index is 30.79 kg/m.   General: The patient is well-developed and well-nourished and in no acute distress . She has some redness and    Neurologic Exam  Mental status: The patient is alert and oriented x 3 at the time of the examination. The patient has apparent normal recent and remote memory, with an apparently normal attention span and concentration ability.   Speech is normal.  Cranial nerves: Extraocular movements are full.   Facial strength and sensation is normal.  Trapezius strength is normal.  No dysarthria is noted.  No obvious hearing deficits are noted.  Motor:  Muscle bulk is normal.  Muscle tone iincreased in right >>left leg. .   Strength is 5/5 in the arms and the left leg. Strength is 4-/5 right hip flexor, 4/5 at the right ankle and toes. 4+/5 quads/hamstrings.  Rapid altering movements were performed well in the hands  Sensory:  There was intact sensation to touch and vibration in the arms and slight asymmetry of vibration sensation in legs with normal temp sensation    Coordination: She has good finger-nose-finger bilaterally.good left but reduced right heel to shin  Gait and station: Station is normal.  The gait is mildly wide and she has right foot drop.   Tandem gait is wide and  Romberg  is negative.  Reflexes: Deep tendon reflexes are increased in both legs.  There is spread at the knees (R>L).  She has nonsustained clonus at the right ankle.  No clonus at the left ankle.     Right foot drop  Multiple sclerosis (HCC) - Plan: Glatiramer  Acetate 40 MG/ML SOSY  Insomnia, unspecified type  Other fatigue  Dysesthesia   1.  Continue glatiramer  acetate twice weekly.  Ok to take benadryl before dose   Check MRI in late 2025 2.   Baclofen  prn.    Continue gabapentin  at bedtime. . 3.   Stay active and exercise as tolerated.  4.   rtc 6 months or sooner if new or worsening neurologic symptoms.      Tamaka Sawin A. Vear, MD, PhD 02/28/2023, 5:15 PM Certified in Neurology, Clinical Neurophysiology, Sleep Medicine, Pain Medicine and Neuroimaging  [facility] [address], [address] [place], [place] [address] [phone]

## 2023-03-01 ENCOUNTER — Telehealth: Payer: Self-pay

## 2023-03-01 ENCOUNTER — Other Ambulatory Visit (HOSPITAL_COMMUNITY): Payer: Self-pay

## 2023-03-01 ENCOUNTER — Other Ambulatory Visit: Payer: Self-pay

## 2023-03-01 DIAGNOSIS — G35 Multiple sclerosis: Secondary | ICD-10-CM

## 2023-03-01 MED ORDER — GLATIRAMER ACETATE 40 MG/ML ~~LOC~~ SOSY
40.0000 mg | PREFILLED_SYRINGE | SUBCUTANEOUS | 3 refills | Status: DC
  Filled 2023-03-01: qty 8, 28d supply, fill #0
  Filled 2023-03-24: qty 8, 28d supply, fill #1
  Filled 2023-04-26: qty 8, 28d supply, fill #2
  Filled 2023-05-25: qty 8, 28d supply, fill #3
  Filled 2023-06-22: qty 8, 28d supply, fill #4
  Filled 2023-07-27: qty 8, 28d supply, fill #5
  Filled 2023-08-24: qty 8, 28d supply, fill #6
  Filled 2023-09-21: qty 8, 28d supply, fill #7
  Filled 2023-11-03: qty 8, 28d supply, fill #8
  Filled 2023-11-24: qty 8, 28d supply, fill #9
  Filled 2023-12-27: qty 8, 28d supply, fill #10
  Filled 2024-01-23: qty 8, 28d supply, fill #11

## 2023-03-01 NOTE — Telephone Encounter (Signed)
Perfect!  Thank you so much.

## 2023-03-01 NOTE — Telephone Encounter (Signed)
 If you will send a new RX over without the DAW but put "[employer] Brand" in the RX notes please. Thanks so much!

## 2023-03-01 NOTE — Telephone Encounter (Signed)
 The pt was seen yesterday for a visit and asking we do DAW for glatiramer  acetate generic specifically because this is what she gets copay assistance for through [employer]. She did not want another generic to be substituted. Do we still need to change the prescription given this info?

## 2023-03-01 NOTE — Telephone Encounter (Signed)
   Pharmacy reached out to me requesting a new RX to be sent over without the DAW1-Brand medically necessary due to the fact that she is enrolled in the [employer] program. If provider is ok with that please escribe new RX. Thanks so much.

## 2023-03-01 NOTE — Telephone Encounter (Signed)
 Ok, I think I have it fixed for you. I sent in a new prescription. Please let me know if you need anything else.

## 2023-03-07 ENCOUNTER — Other Ambulatory Visit: Payer: Self-pay

## 2023-03-09 ENCOUNTER — Other Ambulatory Visit (HOSPITAL_COMMUNITY): Payer: Self-pay

## 2023-03-24 ENCOUNTER — Other Ambulatory Visit: Payer: Self-pay

## 2023-03-24 ENCOUNTER — Other Ambulatory Visit (HOSPITAL_COMMUNITY): Payer: Self-pay

## 2023-03-24 DIAGNOSIS — D2271 Melanocytic nevi of right lower limb, including hip: Secondary | ICD-10-CM | POA: Diagnosis not present

## 2023-03-24 DIAGNOSIS — D2262 Melanocytic nevi of left upper limb, including shoulder: Secondary | ICD-10-CM | POA: Diagnosis not present

## 2023-03-24 DIAGNOSIS — D225 Melanocytic nevi of trunk: Secondary | ICD-10-CM | POA: Diagnosis not present

## 2023-03-24 DIAGNOSIS — D2261 Melanocytic nevi of right upper limb, including shoulder: Secondary | ICD-10-CM | POA: Diagnosis not present

## 2023-03-24 NOTE — Progress Notes (Signed)
 Specialty Pharmacy Refill Coordination Note  Lindsay Carter is a 53 y.o. female contacted today regarding refills of specialty medication(s) Glatiramer  Acetate   Patient requested [name] at [facility] at [place] date: 04/04/23   Medication will be filled on 04/01/23

## 2023-03-29 ENCOUNTER — Other Ambulatory Visit: Payer: Self-pay | Admitting: Neurology

## 2023-03-30 ENCOUNTER — Other Ambulatory Visit: Payer: Self-pay

## 2023-03-30 DIAGNOSIS — R7303 Prediabetes: Secondary | ICD-10-CM | POA: Diagnosis not present

## 2023-03-30 DIAGNOSIS — Z79899 Other long term (current) drug therapy: Secondary | ICD-10-CM | POA: Diagnosis not present

## 2023-03-30 DIAGNOSIS — E210 Primary hyperparathyroidism: Secondary | ICD-10-CM | POA: Diagnosis not present

## 2023-03-30 DIAGNOSIS — E7800 Pure hypercholesterolemia, unspecified: Secondary | ICD-10-CM | POA: Diagnosis not present

## 2023-04-01 ENCOUNTER — Other Ambulatory Visit: Payer: Self-pay

## 2023-04-08 DIAGNOSIS — K589 Irritable bowel syndrome without diarrhea: Secondary | ICD-10-CM | POA: Diagnosis not present

## 2023-04-08 DIAGNOSIS — Z124 Encounter for screening for malignant neoplasm of cervix: Secondary | ICD-10-CM | POA: Diagnosis not present

## 2023-04-08 DIAGNOSIS — Z0000 Encounter for general adult medical examination without abnormal findings: Secondary | ICD-10-CM | POA: Diagnosis not present

## 2023-04-08 DIAGNOSIS — Z23 Encounter for immunization: Secondary | ICD-10-CM | POA: Diagnosis not present

## 2023-04-08 DIAGNOSIS — E7800 Pure hypercholesterolemia, unspecified: Secondary | ICD-10-CM | POA: Diagnosis not present

## 2023-04-08 DIAGNOSIS — R7303 Prediabetes: Secondary | ICD-10-CM | POA: Diagnosis not present

## 2023-04-08 DIAGNOSIS — E669 Obesity, unspecified: Secondary | ICD-10-CM | POA: Diagnosis not present

## 2023-04-08 DIAGNOSIS — E210 Primary hyperparathyroidism: Secondary | ICD-10-CM | POA: Diagnosis not present

## 2023-04-14 DIAGNOSIS — H40013 Open angle with borderline findings, low risk, bilateral: Secondary | ICD-10-CM | POA: Diagnosis not present

## 2023-04-20 ENCOUNTER — Other Ambulatory Visit: Payer: Self-pay

## 2023-04-26 ENCOUNTER — Other Ambulatory Visit: Payer: Self-pay | Admitting: Pharmacy Technician

## 2023-04-26 ENCOUNTER — Other Ambulatory Visit: Payer: Self-pay

## 2023-04-26 ENCOUNTER — Other Ambulatory Visit (HOSPITAL_COMMUNITY): Payer: Self-pay

## 2023-04-26 NOTE — Progress Notes (Signed)
 Specialty Pharmacy Refill Coordination Note  Lindsay Carter is a 53 y.o. female contacted today regarding refills of specialty medication(s) Glatiramer Acetate   Patient requested [name] at [facility] at [place] date: 05/02/23   Medication will be filled on 04/29/23.

## 2023-04-27 LAB — COLOGUARD

## 2023-04-27 LAB — EXTERNAL GENERIC LAB PROCEDURE

## 2023-05-04 DIAGNOSIS — R062 Wheezing: Secondary | ICD-10-CM | POA: Diagnosis not present

## 2023-05-04 DIAGNOSIS — J3089 Other allergic rhinitis: Secondary | ICD-10-CM | POA: Diagnosis not present

## 2023-05-11 DIAGNOSIS — Z1211 Encounter for screening for malignant neoplasm of colon: Secondary | ICD-10-CM | POA: Diagnosis not present

## 2023-05-11 DIAGNOSIS — Z1212 Encounter for screening for malignant neoplasm of rectum: Secondary | ICD-10-CM | POA: Diagnosis not present

## 2023-05-14 LAB — EXTERNAL GENERIC LAB PROCEDURE: COLOGUARD: NEGATIVE

## 2023-05-14 LAB — COLOGUARD: COLOGUARD: NEGATIVE

## 2023-05-24 ENCOUNTER — Other Ambulatory Visit (HOSPITAL_COMMUNITY): Payer: Self-pay

## 2023-05-25 ENCOUNTER — Other Ambulatory Visit: Payer: Self-pay

## 2023-05-25 NOTE — Progress Notes (Signed)
 Specialty Pharmacy Refill Coordination Note  Lindsay Carter is a 53 y.o. female contacted today regarding refills of specialty medication(s) Glatiramer Acetate   Patient requested [name] at [facility] at [place] date: 06/02/23   Medication will be filled on 05/31/23.

## 2023-05-28 ENCOUNTER — Other Ambulatory Visit: Payer: Self-pay | Admitting: Neurology

## 2023-05-31 ENCOUNTER — Other Ambulatory Visit: Payer: Self-pay

## 2023-05-31 NOTE — Telephone Encounter (Signed)
 Last seen on 02/28/23 Follow up scheduled on 10/31/23

## 2023-06-22 ENCOUNTER — Other Ambulatory Visit: Payer: Self-pay

## 2023-06-22 NOTE — Progress Notes (Signed)
 Specialty Pharmacy Refill Coordination Note  Lindsay Carter is a 53 y.o. female contacted today regarding refills of specialty medication(s) Glatiramer  Acetate   Patient requested [name] at [facility] at [place] date: 06/30/23   Medication will be filled on 05.14.25.

## 2023-06-22 NOTE — Progress Notes (Signed)
 Specialty Pharmacy Ongoing Clinical Assessment Note  Lindsay Carter is a 53 y.o. female who is being followed by the specialty pharmacy service for RxSp Multiple Sclerosis   Patient's specialty medication(s) reviewed today: Glatiramer  Acetate   Missed doses in the last 4 weeks: 0   Patient/Caregiver did not have any additional questions or concerns.   Therapeutic benefit summary: Patient is achieving benefit   Adverse events/side effects summary: No adverse events/side effects   Patient's therapy is appropriate to: Continue    Goals Addressed             This Visit's Progress    Minimize recurrence of flares   On track    Patient is on track. Patient will maintain adherence. Patient stated that her MS is stable. Per office visit on 02/28/23, patient will have another MRI in late 2025 to check status.         Follow up:  6 months  [facility]

## 2023-07-27 ENCOUNTER — Other Ambulatory Visit: Payer: Self-pay | Admitting: Pharmacy Technician

## 2023-07-27 ENCOUNTER — Other Ambulatory Visit: Payer: Self-pay

## 2023-07-27 NOTE — Progress Notes (Signed)
 Specialty Pharmacy Refill Coordination Note  Lindsay Carter is a 53 y.o. female contacted today regarding refills of specialty medication(s) Glatiramer  Acetate   Patient requested [name] at [facility] at [place] date: 08/03/23   Medication will be filled on 08/03/23.

## 2023-07-28 ENCOUNTER — Other Ambulatory Visit (HOSPITAL_COMMUNITY): Payer: Self-pay

## 2023-07-29 ENCOUNTER — Other Ambulatory Visit: Payer: Self-pay

## 2023-08-03 ENCOUNTER — Other Ambulatory Visit: Payer: Self-pay

## 2023-08-04 ENCOUNTER — Other Ambulatory Visit: Payer: Self-pay

## 2023-08-17 ENCOUNTER — Other Ambulatory Visit: Payer: Self-pay

## 2023-08-24 ENCOUNTER — Other Ambulatory Visit: Payer: Self-pay

## 2023-08-24 NOTE — Progress Notes (Signed)
 Specialty Pharmacy Refill Coordination Note  Lindsay Carter is a 53 y.o. female contacted today regarding refills of specialty medication(s) Glatiramer  Acetate   Patient requested [name] at [facility] at [place] date: 08/29/23   Medication will be filled on 08/26/23.

## 2023-08-25 ENCOUNTER — Other Ambulatory Visit: Payer: Self-pay

## 2023-09-21 ENCOUNTER — Other Ambulatory Visit: Payer: Self-pay

## 2023-09-21 NOTE — Progress Notes (Signed)
 Specialty Pharmacy Refill Coordination Note  Lindsay Carter is a 53 y.o. female contacted today regarding refills of specialty medication(s) Glatiramer  Acetate   Patient requested [name] at [facility] at [place] date: 10/03/23   Medication will be filled on 08.15.25.

## 2023-09-22 ENCOUNTER — Other Ambulatory Visit: Payer: Self-pay

## 2023-09-22 ENCOUNTER — Other Ambulatory Visit (HOSPITAL_COMMUNITY): Payer: Self-pay

## 2023-09-24 ENCOUNTER — Other Ambulatory Visit: Payer: Self-pay | Admitting: Neurology

## 2023-09-26 ENCOUNTER — Other Ambulatory Visit: Payer: Self-pay

## 2023-09-30 ENCOUNTER — Other Ambulatory Visit: Payer: Self-pay

## 2023-10-11 DIAGNOSIS — H40013 Open angle with borderline findings, low risk, bilateral: Secondary | ICD-10-CM | POA: Diagnosis not present

## 2023-10-17 IMAGING — MG MM DIGITAL SCREENING BILAT W/ TOMO AND CAD
6 of 10 series · 6 of 30 positions shown · non-contrast
Comparison: Previous exam(s).

CLINICAL DATA: Screening.

EXAM:
DIGITAL SCREENING BILATERAL MAMMOGRAM WITH TOMOSYNTHESIS AND CAD
TECHNIQUE: Bilateral screening digital craniocaudal and mediolateral oblique
mammograms were obtained. Bilateral screening digital breast
tomosynthesis was performed. The images were evaluated with
computer-aided detection.

[L CC synth-2D]
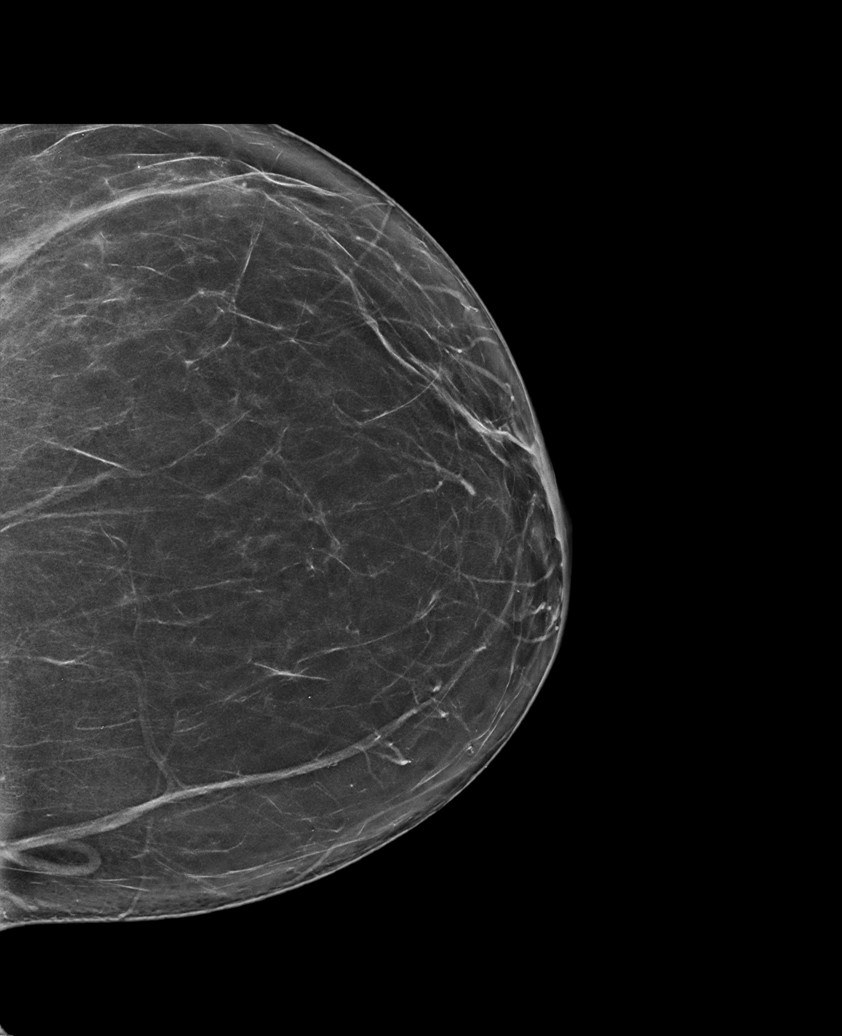

[L MLO synth-2D (1 of 2)]
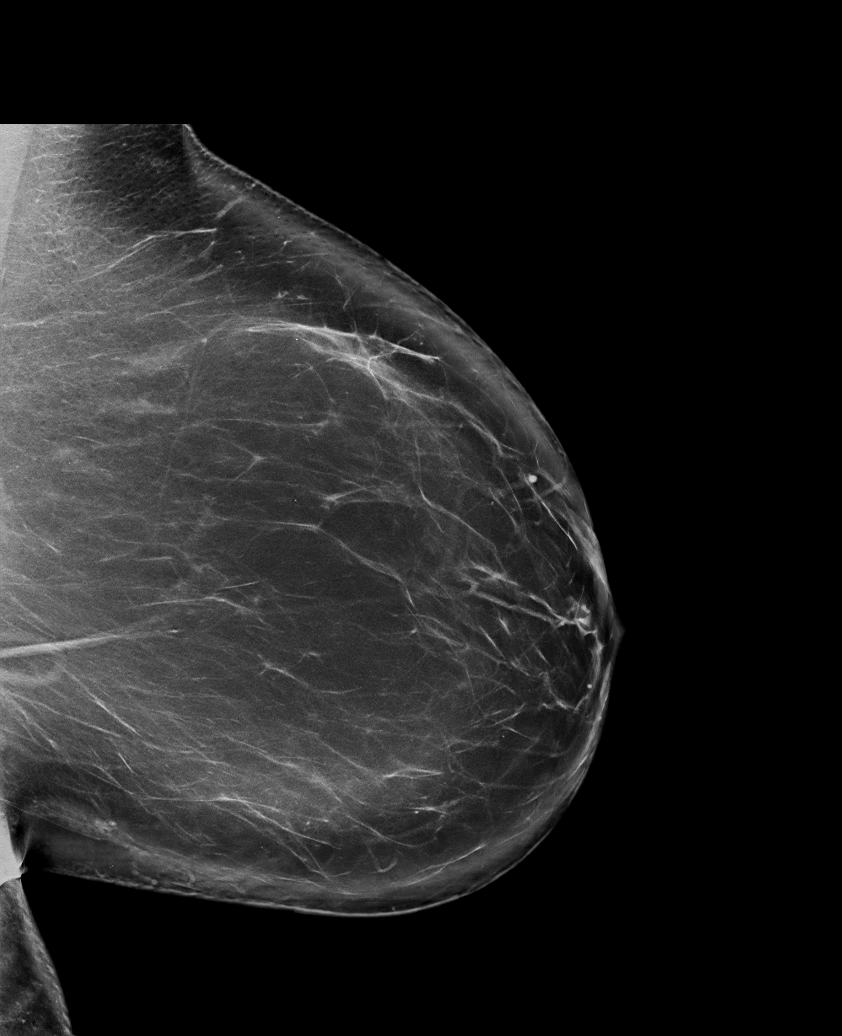

[R MLO synth-2D]
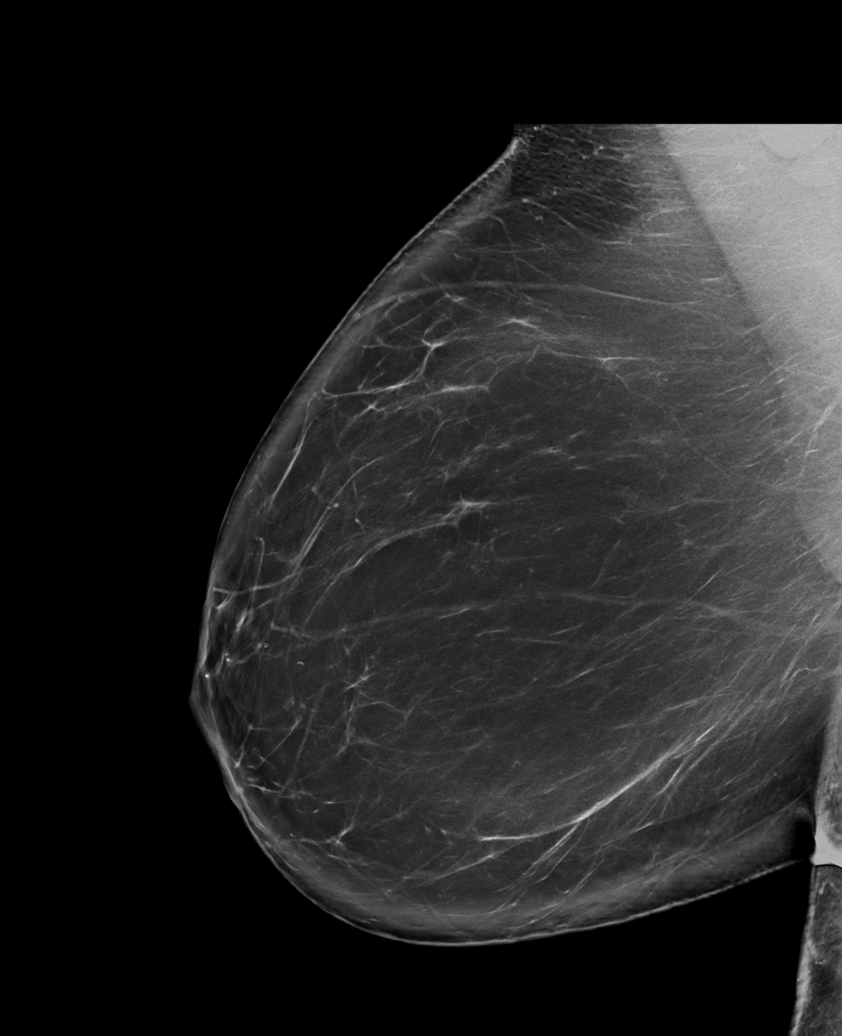

[L MLO synth-2D (2 of 2)]
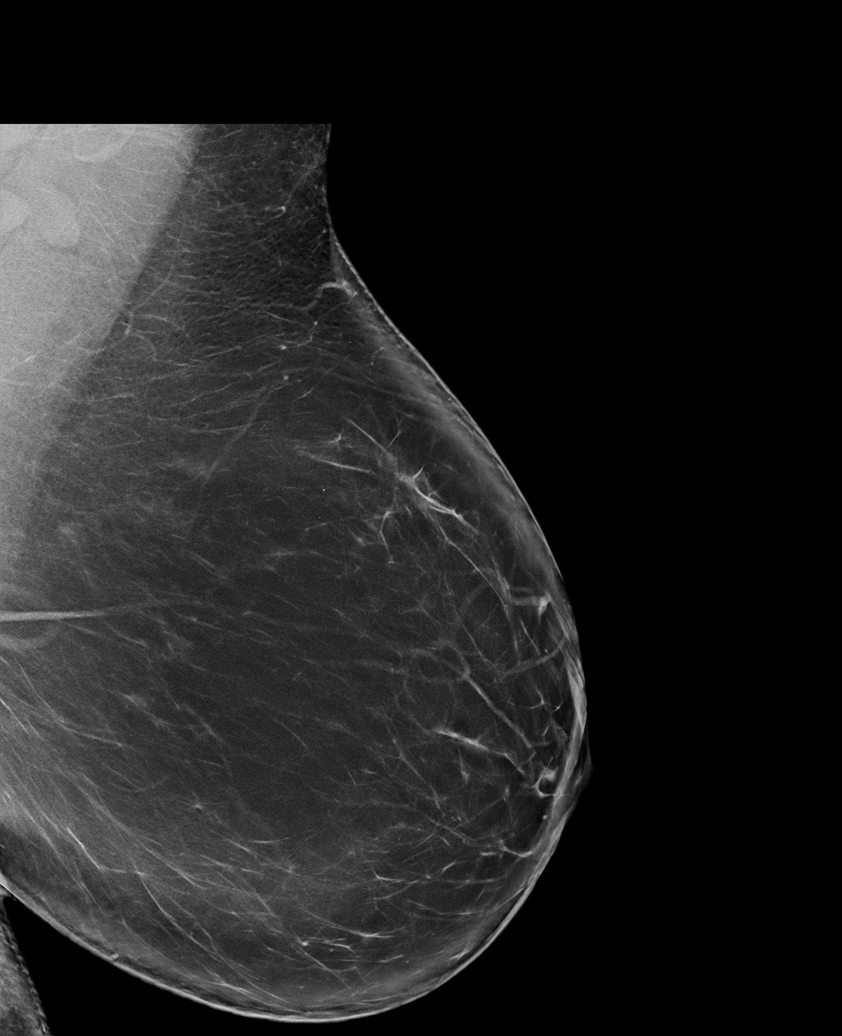

[R CC synth-2D]
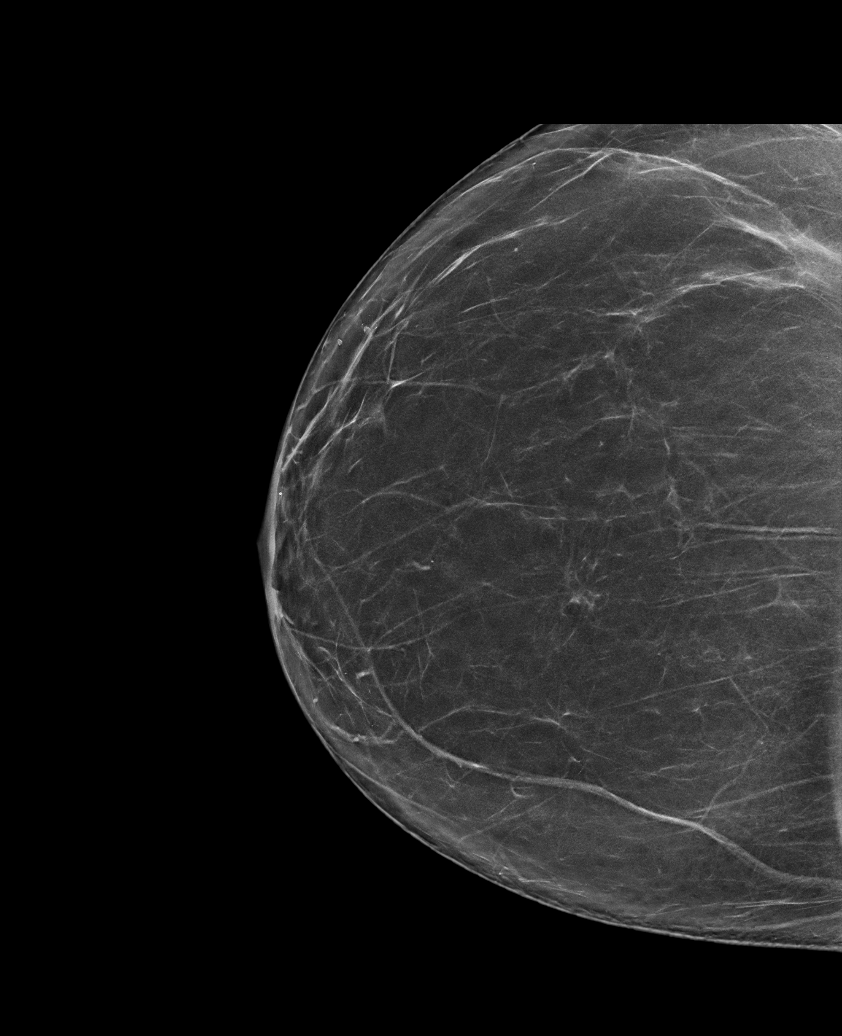

[R MLO tomo · tomo slice 54/107.0]
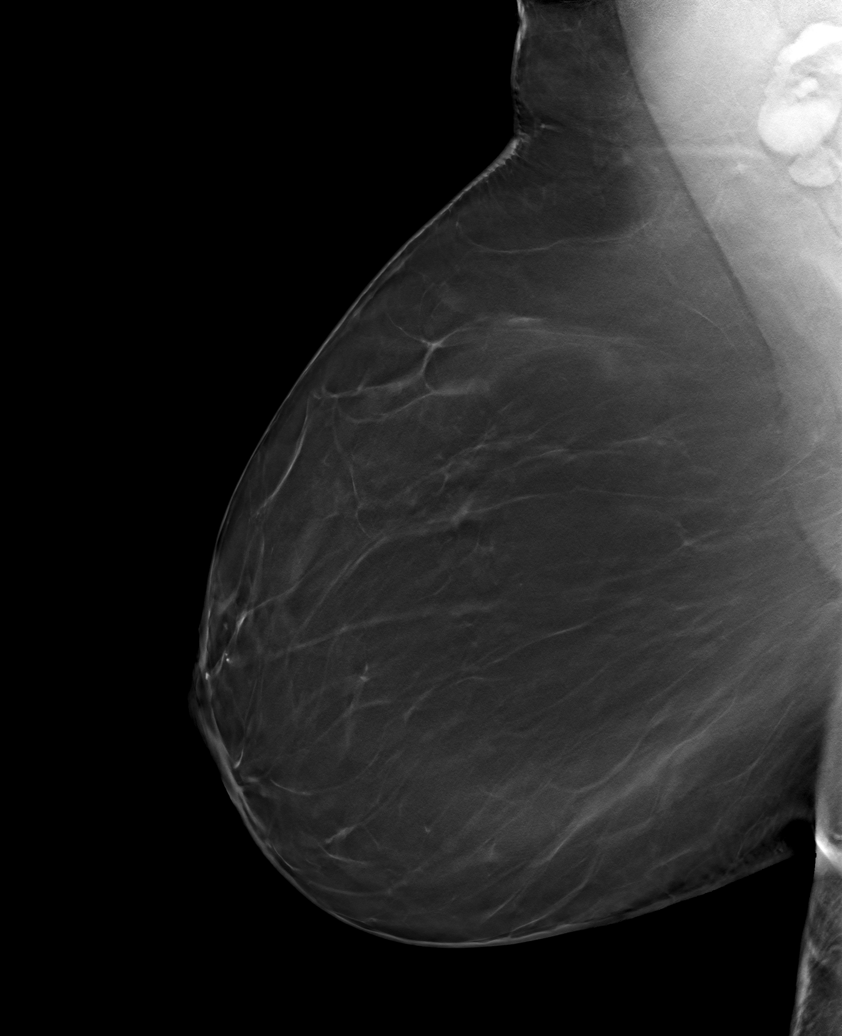

[6 of 30 positions shown; findings below may reference images not displayed]

ACR Breast Density Category b: There are scattered areas of
fibroglandular density.
FINDINGS: There are no findings suspicious for malignancy.
IMPRESSION: No mammographic evidence of malignancy. A result letter of this
screening mammogram will be mailed directly to the patient.

RECOMMENDATION:
Screening mammogram in one year. (Code:51-O-LD2)

BI-RADS CATEGORY  1: Negative.

## 2023-10-25 ENCOUNTER — Other Ambulatory Visit: Payer: Self-pay

## 2023-10-25 ENCOUNTER — Other Ambulatory Visit: Payer: Self-pay | Admitting: Pharmacy Technician

## 2023-10-25 NOTE — Progress Notes (Addendum)
 Specialty Pharmacy Refill Coordination Note  Lindsay Carter is a 53 y.o. female contacted today regarding refills of specialty medication(s) Glatiramer  Acetate   Patient requested [name] at [facility] at [place] date: 11/03/23   Medication will be filled on 11/03/23.  ______________________________  **9/18 Medication did not come in today. Per Marijo med will come in 9/19. Reached out to patient and is aware of delay.  Patient will pick up medication on 9/23 when back in town.

## 2023-10-31 ENCOUNTER — Ambulatory Visit: Payer: BC Managed Care – PPO | Admitting: Neurology

## 2023-10-31 ENCOUNTER — Encounter: Payer: Self-pay | Admitting: Neurology

## 2023-10-31 VITALS — BP 183/104 | HR 105 | Ht 69.0 in | Wt 200.0 lb

## 2023-10-31 DIAGNOSIS — E210 Primary hyperparathyroidism: Secondary | ICD-10-CM | POA: Insufficient documentation

## 2023-10-31 DIAGNOSIS — R208 Other disturbances of skin sensation: Secondary | ICD-10-CM

## 2023-10-31 DIAGNOSIS — M21371 Foot drop, right foot: Secondary | ICD-10-CM | POA: Diagnosis not present

## 2023-10-31 DIAGNOSIS — G35 Multiple sclerosis: Secondary | ICD-10-CM | POA: Diagnosis not present

## 2023-10-31 DIAGNOSIS — G4700 Insomnia, unspecified: Secondary | ICD-10-CM | POA: Diagnosis not present

## 2023-10-31 DIAGNOSIS — R5383 Other fatigue: Secondary | ICD-10-CM

## 2023-10-31 DIAGNOSIS — R7303 Prediabetes: Secondary | ICD-10-CM | POA: Insufficient documentation

## 2023-10-31 NOTE — Progress Notes (Signed)
 [facility]  PATIENT: Lindsay Carter DOB: Jan 21, 1971  REFERRING DOCTOR OR PCP:  [facility])   SOURCE: patient and [facility] records                    _________________________________   HISTORICAL  CHIEF COMPLAINT:  Chief Complaint  Patient presents with   RM10/MS    Pt is here Alone. Pt states she has been stable and tolerating Glatiramer  Acetate well.     HISTORY OF PRESENT ILLNESS:  Lindsay Carter is a 53 y.o. woman with relapsing remitting multiple sclerosis.   Update 10/31/2023 She feels her MS is stable.  She is on glatiramer  40 mg two times a week.   She is tolerating it well if she takes a Benadryl before each shot.       Her gait is about the same but she had alone fall - no injury. . She rarely uses her cane.  She has a  right foot drop. She also has right leg spasticity, worse in cold weather.  On a typical day, she can walk about 150 - 200  feet without a rest,      She uses the rail on stair.  Her right hand is fine.  She has no arm weakness but has some right tingling at times.    She does not need anti-spasticity medication.   She has numbness in her right leg that is always present.   This is uncomfortable at times.  Bladder function is doing well.    Vision is fine.     She has mild stable fatigue, worse with heat.  Mood is ok.   Cognition is fine.   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   She takes etodolac  for joint pain.   She rarely  baclofen  for spasticity.   Gabapentin  helps the cramps at night (makes her sleepy during the day)     Vit D 12/01/2021 was normal (49).    She has an office job.   She is remote 3 days a week and at the office 2 days a week.        MS History:   Prior to 2010, she presented with several episodes of numbness and MRI of the spine was reportedly normal (however on my recent review there appear to be 2  thoracic plaques at T4T5 and T6T7).   In 2010, she had an episode of limping and was referred to me.   MRI of the spine showed one focus and MRI of the brain showed multiple foci consistent with t multiple sclerosis.  There was one enhancing plaque in the left posterior frontal lobe.   She was started on Copaxone  and noted mild lipoatrophy. Of note, she is HLA DR 1501 positive. She was clinically stable, she initially went to Copaxone  20 mg every other day in 2012 and then switched to 40 mg 3 times a week in 2014. In 2015 she switch to 40 mg twice a week and has remained relapse free.   Imaging review:  MRI brain 03/04/2021 showed T2/FLAIR hyperintense foci in the periventricular, juxtacortical and deep white matter in a pattern consistent with chronic demyelinating plaque associated with multiple sclerosis. None of the foci appear to be acute. Compared to the MRI from 09/27/2018, there are no new lesions   MRI of the brain 09/27/2018 shows T2/FLAIR hyperintense foci in the  hemispheres in a pattern and configuration consistent with chronic demyelinating plaque associated with multiple sclerosis. None of the foci appeared to be acute. When compared to the MRI dated 08/11/2016, there was no interval change.   There is a normal enhancement pattern. There are no acute findings.  MRI of the cervical spine 08/11/2016 shows a focus within the right anterior spinal cord adjacent to C4 consistent with a chronic demyelinating plaque associated with multiple sclerosis    At C2-C3, there is left facet hypertrophy social with a large spur that contacts the thecal sac without causing spinal cord compression. There is no nerve root compression.    At C5-C6, there is a left paramedian disc protrusion causing mild left foraminal narrowing but no nerve root compression.    There is a normal enhancement pattern.   REVIEW OF SYSTEMS: Constitutional: No fevers, chills, sweats, or change in appetite.   Has fatigue Eyes: No visual  changes, double vision, eye pain Ear, nose and throat: No hearing loss, ear pain, nasal congestion, sore throat Cardiovascular: No chest pain, palpitations Respiratory:  No shortness of breath at rest or with exertion.   No wheezes GastrointestinaI: No nausea, vomiting, diarrhea, abdominal pain, fecal incontinence Genitourinary:  No dysuria, urinary retention.  Minimal frequency and minimal stress incontinence.  No nocturia. Musculoskeletal:  No neck pain.  Mild Lower back pain.  Has R and L knee pain, right hip pain.  Integumentary: No rash, pruritus, skin lesions Neurological: as above Psychiatric: No depression at this time.  No anxiety Endocrine: No palpitations, diaphoresis, change in appetite, change in weigh or increased thirst Hematologic/Lymphatic:  No anemia, purpura, petechiae. Allergic/Immunologic: No itchy/runny eyes, nasal congestion, recent allergic reactions, rashes  ALLERGIES: Allergies  Allergen Reactions   Erythromycin Nausea And Vomiting    HOME MEDICATIONS:  Current Outpatient Medications:    acetaminophen (TYLENOL) 500 MG tablet, Take 1,000 mg by mouth every 6 (six) hours as needed., Disp: , Rfl:    baclofen  (LIORESAL ) 10 MG tablet, One po tid prn, Disp: 30 each, Rfl: 0   cetirizine (ZYRTEC) 10 MG tablet, Take 10 mg by mouth daily., Disp: , Rfl: 4   cholecalciferol (VITAMIN D ) 1000 UNITS tablet, Take 2,000 Units by mouth daily., Disp: , Rfl:    Cranberry 125 MG TABS, Take 125 mg by mouth daily., Disp: , Rfl:    etodolac  (LODINE ) 400 MG tablet, TAKE 1 TABLET BY MOUTH TWICE A DAY, Disp: 180 tablet, Rfl: 0   fluticasone (FLONASE) 50 MCG/ACT nasal spray, , Disp: , Rfl: 2   gabapentin  (NEURONTIN ) 100 MG capsule, TAKE 1 CAPSULE (100 MG TOTAL) BY MOUTH THREE TIMES DAILY., Disp: 270 capsule, Rfl: 1   Glatiramer  Acetate 40 MG/ML SOSY, Inject 40 mg into the skin 2 (two) times a week., Disp: 24 mL, Rfl: 3   hyoscyamine (ANASPAZ) 0.125 MG TBDP disintergrating tablet, Place  0.125 mg under the tongue as needed. , Disp: , Rfl:    JUNEL 1/20 1-20 MG-MCG tablet, Take 1 tablet by mouth daily., Disp: , Rfl: 11   loratadine-pseudoephedrine (CLARITIN-D 12-HOUR) 5-120 MG tablet, Take 1 tablet by mouth at bedtime as needed for allergies., Disp: , Rfl:    Melatonin 3 MG CAPS, Take 3 mg by mouth at bedtime., Disp: , Rfl:    Menthol, Topical Analgesic, (BIOFREEZE EX), Apply 1 tablet topically daily. Right hip, Disp: , Rfl:    Minoxidil 5 % FOAM, Apply topically., Disp: , Rfl:    polyethylene glycol (MIRALAX / GLYCOLAX) packet,  Take 17 g by mouth daily., Disp: , Rfl:    Rutin 50 MG TABS, Take 50 tablets by mouth daily. , Disp: , Rfl:    SOOLANTRA 1 % CREA, Apply 1 application. topically daily., Disp: , Rfl:    vitamin C (ASCORBIC ACID) 500 MG tablet, Take 1,000 mg by mouth daily. Takes gummy, not tablet, Disp: , Rfl:    rosuvastatin (CRESTOR) 20 MG tablet, Take 20 mg by mouth daily. (Patient not taking: Reported on 10/31/2023), Disp: , Rfl:   PAST MEDICAL HISTORY: Past Medical History:  Diagnosis Date   Headache    Multiple sclerosis (HCC)    Vision abnormalities     PAST SURGICAL HISTORY: Past Surgical History:  Procedure Laterality Date   KNEE ARTHROSCOPY Left    2006    FAMILY HISTORY: Family History  Problem Relation Age of Onset   Hypertension Mother    Ataxia Father    CAD Father    Parkinson's disease Father    Bladder Cancer Father    Breast cancer Paternal Aunt 46   Breast cancer Maternal Grandmother 64    SOCIAL HISTORY:  Social History   Socioeconomic History   Marital status: Married    Spouse name: Not on file   Number of children: Not on file   Years of education: Not on file   Highest education level: Not on file  Occupational History   Occupation: Print production planner  Tobacco Use   Smoking status: Never   Smokeless tobacco: Never  Vaping Use   Vaping status: Never Used  Substance and Sexual Activity   Alcohol use: Yes     Alcohol/week: 0.0 standard drinks of alcohol    Comment: 2-3 beers per week/fim   Drug use: No   Sexual activity: Not on file  Other Topics Concern   Not on file  Social History Narrative   Right handed    Caffeine use: 1/2 cup coffee per day   Social Drivers of Health   Financial Resource Strain: Not on file  Food Insecurity: Not on file  Transportation Needs: Not on file  Physical Activity: Not on file  Stress: Not on file  Social Connections: Not on file  Intimate Partner Violence: Not on file     PHYSICAL EXAM  Vitals:   10/31/23 0812  BP: (!) 168/102  Pulse: (!) 114  SpO2: 99%  Weight: 200 lb (90.7 kg)  Height: 5' 9 (1.753 m)     Body mass index is 29.53 kg/m.   General: The patient is well-developed and well-nourished and in no acute distress . She has some redness and    Neurologic Exam  Mental status: The patient is alert and oriented x 3 at the time of the examination. The patient has apparent normal recent and remote memory, with an apparently normal attention span and concentration ability.   Speech is normal.  Cranial nerves: Extraocular movements are full.   Facial strength and sensation is normal.  Trapezius strength is normal.  No dysarthria is noted.  No obvious hearing deficits are noted.  Motor:  Muscle bulk is normal.  Muscle tone iincreased in right >>left leg. .   Strength is 5/5 in the arms and the left leg. Strength is 4-/5 right hip flexor, 4/5 at the right ankle and toes. 4+/5 quads/hamstrings.  Rapid altering movements were performed well in the hands  Sensory:  There was intact sensation to touch and vibration in the arms and slight asymmetry of vibration sensation in legs with normal temp sensation    Coordination: She has good finger-nose-finger  bilaterally.good left but reduced right heel to shin  Gait and station: Station is normal.  The gait is mildly wide and she has right foot drop.  Her tandem gait is poor.  Her  Romberg is negative.  Reflexes: Deep tendon reflexes are increased in both legs.  There is spread at the knees (R>L).  She has nonsustained clonus at the right ankle.  No clonus at the left ankle.     Multiple sclerosis (HCC)  Right foot drop  Insomnia, unspecified type  Other fatigue  Dysesthesia   1.  Continue glatiramer  acetate twice weekly.  Ok to take benadryl before dose   We discussed tolebrutinib may be available soon.    Check MRI to see if breakthrough activity 2.   Baclofen  prn.    Continue gabapentin  at bedtime. . 3.   Stay active and exercise as tolerated.  4.   rtc 6 months or sooner if new or worsening neurologic symptoms.      Ruchy Wildrick A. Vear, MD, PhD 10/31/2023, 8:56 AM Certified in Neurology, Clinical Neurophysiology, Sleep Medicine, Pain Medicine and Neuroimaging  [facility] [address], [address] [place], [place] [address] [phone]

## 2023-11-03 ENCOUNTER — Other Ambulatory Visit: Payer: Self-pay

## 2023-11-04 ENCOUNTER — Other Ambulatory Visit (HOSPITAL_COMMUNITY): Payer: Self-pay

## 2023-11-04 ENCOUNTER — Other Ambulatory Visit: Payer: Self-pay

## 2023-11-07 ENCOUNTER — Other Ambulatory Visit: Payer: Self-pay

## 2023-11-11 ENCOUNTER — Other Ambulatory Visit: Payer: Self-pay

## 2023-11-14 ENCOUNTER — Other Ambulatory Visit: Payer: Self-pay

## 2023-11-24 ENCOUNTER — Other Ambulatory Visit: Payer: Self-pay

## 2023-11-24 DIAGNOSIS — H00021 Hordeolum internum right upper eyelid: Secondary | ICD-10-CM | POA: Diagnosis not present

## 2023-11-24 NOTE — Progress Notes (Signed)
 Specialty Pharmacy Refill Coordination Note  Lindsay Carter is a 53 y.o. female contacted today regarding refills of specialty medication(s) Glatiramer  Acetate   Patient requested Lindsay Carter at [facility] at [place] date: 12/02/23   Medication will be filled on 10.16.25.

## 2023-11-29 ENCOUNTER — Other Ambulatory Visit: Payer: Self-pay | Admitting: Family Medicine

## 2023-11-29 DIAGNOSIS — Z1231 Encounter for screening mammogram for malignant neoplasm of breast: Secondary | ICD-10-CM

## 2023-12-01 ENCOUNTER — Other Ambulatory Visit: Payer: Self-pay

## 2023-12-04 ENCOUNTER — Other Ambulatory Visit: Payer: Self-pay | Admitting: Neurology

## 2023-12-04 ENCOUNTER — Other Ambulatory Visit (HOSPITAL_COMMUNITY): Payer: Self-pay

## 2023-12-05 ENCOUNTER — Other Ambulatory Visit: Payer: Self-pay

## 2023-12-05 NOTE — Telephone Encounter (Signed)
 Last seen on 10/31/23 per note   Baclofen  prn. Continue gabapentin  at bedtime. . Follow up scheduled on 06/04/24

## 2023-12-07 ENCOUNTER — Other Ambulatory Visit: Payer: Self-pay

## 2023-12-07 ENCOUNTER — Other Ambulatory Visit (HOSPITAL_COMMUNITY): Payer: Self-pay

## 2023-12-09 ENCOUNTER — Other Ambulatory Visit: Payer: Self-pay

## 2023-12-13 ENCOUNTER — Other Ambulatory Visit: Payer: Self-pay

## 2023-12-15 ENCOUNTER — Other Ambulatory Visit: Payer: Self-pay

## 2023-12-19 ENCOUNTER — Other Ambulatory Visit: Payer: Self-pay

## 2023-12-20 ENCOUNTER — Ambulatory Visit
Admission: RE | Admit: 2023-12-20 | Discharge: 2023-12-20 | Disposition: A | Source: Ambulatory Visit | Attending: Family Medicine | Admitting: Family Medicine

## 2023-12-20 DIAGNOSIS — Z1231 Encounter for screening mammogram for malignant neoplasm of breast: Secondary | ICD-10-CM | POA: Diagnosis not present

## 2023-12-21 ENCOUNTER — Other Ambulatory Visit: Payer: Self-pay

## 2023-12-24 ENCOUNTER — Other Ambulatory Visit: Payer: Self-pay | Admitting: Neurology

## 2023-12-26 ENCOUNTER — Other Ambulatory Visit: Payer: Self-pay

## 2023-12-27 ENCOUNTER — Other Ambulatory Visit: Payer: Self-pay

## 2023-12-27 ENCOUNTER — Other Ambulatory Visit (HOSPITAL_COMMUNITY): Payer: Self-pay

## 2023-12-27 NOTE — Progress Notes (Signed)
 Patient Satisfaction Survey Completed.

## 2023-12-27 NOTE — Progress Notes (Signed)
 Specialty Pharmacy Refill Coordination Note  Lindsay Carter is a 53 y.o. female contacted today regarding refills of specialty medication(s) Glatiramer  Acetate   Patient requested [name] at [facility] at [place] date: 01/03/24   Medication will be filled on: 01/02/24

## 2023-12-28 ENCOUNTER — Other Ambulatory Visit: Payer: Self-pay

## 2023-12-28 ENCOUNTER — Other Ambulatory Visit (HOSPITAL_COMMUNITY): Payer: Self-pay

## 2023-12-28 NOTE — Telephone Encounter (Signed)
 Last seen on 10/31/23 Follow up scheduled on 06/04/24

## 2024-01-02 ENCOUNTER — Other Ambulatory Visit: Payer: Self-pay

## 2024-01-03 ENCOUNTER — Ambulatory Visit

## 2024-01-03 ENCOUNTER — Ambulatory Visit: Payer: Self-pay | Admitting: Neurology

## 2024-01-03 DIAGNOSIS — R208 Other disturbances of skin sensation: Secondary | ICD-10-CM | POA: Diagnosis not present

## 2024-01-03 DIAGNOSIS — G35D Multiple sclerosis, unspecified: Secondary | ICD-10-CM | POA: Diagnosis not present

## 2024-01-03 NOTE — Telephone Encounter (Signed)
-----   Message from Charlie DELENA Crete sent at 01/03/2024  1:30 PM EST ----- With present that the MRI of the brain looked good.  It showed the old MS lesions but there were no new ones ----- Message ----- From: Crete Charlie DELENA, MD Sent: 01/03/2024   1:20 PM EST To: Charlie DELENA Crete, MD

## 2024-01-03 NOTE — Telephone Encounter (Signed)
 Called and relayed results of MRI brain looked good, no new lesions.  She appreciated call back.

## 2024-01-04 ENCOUNTER — Other Ambulatory Visit: Payer: Self-pay

## 2024-01-06 ENCOUNTER — Other Ambulatory Visit: Payer: Self-pay

## 2024-01-09 ENCOUNTER — Other Ambulatory Visit: Payer: Self-pay

## 2024-01-11 ENCOUNTER — Other Ambulatory Visit: Payer: Self-pay

## 2024-01-16 ENCOUNTER — Other Ambulatory Visit: Payer: Self-pay

## 2024-01-18 ENCOUNTER — Other Ambulatory Visit: Payer: Self-pay

## 2024-01-23 ENCOUNTER — Other Ambulatory Visit: Payer: Self-pay

## 2024-01-23 ENCOUNTER — Other Ambulatory Visit (HOSPITAL_COMMUNITY): Payer: Self-pay

## 2024-01-23 NOTE — Progress Notes (Signed)
 Specialty Pharmacy Refill Coordination Note  Lindsay Carter is a 53 y.o. female contacted today regarding refills of specialty medication(s) Glatiramer  Acetate   Patient requested [name] at [facility] at [place] date: 02/01/24   Medication will be filled on: 01/31/24

## 2024-01-31 ENCOUNTER — Other Ambulatory Visit: Payer: Self-pay

## 2024-02-23 ENCOUNTER — Other Ambulatory Visit: Payer: Self-pay

## 2024-02-23 ENCOUNTER — Other Ambulatory Visit: Payer: Self-pay | Admitting: Neurology

## 2024-02-23 ENCOUNTER — Other Ambulatory Visit (HOSPITAL_COMMUNITY): Payer: Self-pay

## 2024-02-23 DIAGNOSIS — G35D Multiple sclerosis, unspecified: Secondary | ICD-10-CM

## 2024-02-23 MED ORDER — GLATIRAMER ACETATE 40 MG/ML ~~LOC~~ SOSY
40.0000 mg | PREFILLED_SYRINGE | SUBCUTANEOUS | 3 refills | Status: AC
  Filled 2024-02-23: qty 24, 84d supply, fill #0
  Filled 2024-02-24: qty 8, 28d supply, fill #0

## 2024-02-23 NOTE — Telephone Encounter (Signed)
 Last seen on 10/31/23 Follow up scheduled on 06/04/24

## 2024-02-24 ENCOUNTER — Other Ambulatory Visit: Payer: Self-pay

## 2024-02-24 NOTE — Progress Notes (Signed)
 Specialty Pharmacy Refill Coordination Note  Lindsay Carter is a 54 y.o. female contacted today regarding refills of specialty medication(s) Glatiramer  Acetate   Patient requested [name] at [facility] at [place] date: 03/05/24   Medication will be filled on: 03/02/24

## 2024-03-02 ENCOUNTER — Other Ambulatory Visit: Payer: Self-pay

## 2024-03-22 ENCOUNTER — Other Ambulatory Visit (HOSPITAL_COMMUNITY): Payer: Self-pay

## 2024-03-23 ENCOUNTER — Telehealth: Payer: Self-pay | Admitting: Neurology

## 2024-03-23 MED ORDER — BACLOFEN 10 MG PO TABS: ORAL_TABLET | ORAL | 5 refills | Status: AC

## 2024-03-23 NOTE — Telephone Encounter (Signed)
 Pt called to request new order for medication baclofen  (LIORESAL ) 10 MG tablet  to be sent to Pt Pharmacy    [facility] [address] [place] - [address] (Ph: [phone])

## 2024-03-23 NOTE — Telephone Encounter (Signed)
 refilled

## 2024-06-04 ENCOUNTER — Ambulatory Visit: Admitting: Neurology
